# Patient Record
Sex: Female | Born: 1960
Health system: Southern US, Community
[De-identification: ages and names within clinical notes are randomized; demographics above are authoritative.]

## PROBLEM LIST (undated history)

## (undated) DIAGNOSIS — F419 Anxiety disorder, unspecified: Secondary | ICD-10-CM

## (undated) DIAGNOSIS — I10 Essential (primary) hypertension: Secondary | ICD-10-CM

## (undated) DIAGNOSIS — Z973 Presence of spectacles and contact lenses: Secondary | ICD-10-CM

## (undated) DIAGNOSIS — M1990 Unspecified osteoarthritis, unspecified site: Secondary | ICD-10-CM

## (undated) HISTORY — PX: DILATION AND CURETTAGE OF UTERUS: SHX78

---

## 1983-09-18 HISTORY — PX: ABDOMINAL HYSTERECTOMY: SHX81

## 1986-09-17 HISTORY — PX: LAPAROSCOPIC OOPHORECTOMY: SUR783

## 1987-09-18 HISTORY — PX: CARPAL TUNNEL RELEASE: SHX101

## 1996-09-17 HISTORY — PX: WISDOM TOOTH EXTRACTION: SHX21

## 2011-01-12 ENCOUNTER — Other Ambulatory Visit (HOSPITAL_COMMUNITY): Payer: Self-pay | Admitting: Internal Medicine

## 2011-01-12 DIAGNOSIS — Z1231 Encounter for screening mammogram for malignant neoplasm of breast: Secondary | ICD-10-CM

## 2011-01-25 ENCOUNTER — Ambulatory Visit (HOSPITAL_COMMUNITY)
Admission: RE | Admit: 2011-01-25 | Discharge: 2011-01-25 | Disposition: A | Discharge status: Home / Self Care | Payer: Self-pay | Source: Ambulatory Visit | Attending: Internal Medicine | Admitting: Internal Medicine

## 2011-01-25 DIAGNOSIS — Z1231 Encounter for screening mammogram for malignant neoplasm of breast: Secondary | ICD-10-CM

## 2013-09-22 ENCOUNTER — Ambulatory Visit (INDEPENDENT_AMBULATORY_CARE_PROVIDER_SITE_OTHER): Payer: No Typology Code available for payment source | Admitting: Family Medicine

## 2013-09-22 ENCOUNTER — Telehealth: Payer: Self-pay

## 2013-09-22 VITALS — BP 128/80 | HR 78 | Temp 98.4°F | Resp 17 | Ht 65.0 in | Wt 185.0 lb

## 2013-09-22 DIAGNOSIS — F411 Generalized anxiety disorder: Secondary | ICD-10-CM

## 2013-09-22 DIAGNOSIS — R6889 Other general symptoms and signs: Secondary | ICD-10-CM

## 2013-09-22 MED ORDER — ALPRAZOLAM 1 MG PO TABS: ORAL_TABLET | ORAL | Status: DC

## 2013-09-22 NOTE — Telephone Encounter (Signed)
I have never advised a patient not to come in because we are busy. We can not excuse patient from work, she was not seen. I have advised her if she is sick she should be seen. Advised her if we are open we will see her.

## 2013-09-22 NOTE — Patient Instructions (Signed)
Drink plenty of fluids and get enough rest  Tylenol or ibuprofen for aching  Tramadol is to be continued for the hand pain if it is bad  Minimize the use of the antianxiety medication, alprazolam   Return if worse

## 2013-09-22 NOTE — Progress Notes (Signed)
Subjective: 53 year old lady who has been sick since about Friday. She started out with a headache and a slight cough and achy and just feeling lousy. She was unable to get in easily due to the crowd, so she put off coming to the doctor. She has continued to have some headache and generalized malaise. No sore throat. No ear pain. Continues with a little cough nonproductive. She is a nonsmoker. She had a little nausea this morning, but not like she was going to actually going to vomit. She works at 2 jobs, putting in long hours. She did get a flu shot this year.  Objective: TMs are normal. Throat clear. Neck supple without significant nodes. Chest is clear to auscultation. Heart regular without murmurs. She has a splint on her right wrist. Dr. Cheree DittoGraham he is going to do surgery on it. She saw Dr. Lyman BishopLawrence time at the other clinic he volunteers at and he gave her something for anxiety there.  Assessment: Flulike illness Generalized malaise Wrist pain Anxiety  Plan: Refill the alprazolam  Treat the flu symptoms symptomatically. Discussed with patient.

## 2013-09-22 NOTE — Telephone Encounter (Signed)
PT STATES SHE WAS COMING OVER TO BE SEEN ON Friday AND WAS TOLD BY SOMEONE HERE NOT TO COME BECAUSE IT WAS SO HECTIC AT THE TIME, NOW SHE NEED A NOTE FOR Friday EXCUSING HER FROM WORK SINCE WE TOLD HER NOT TO COME OVER. PLEASE CALL PT AT [phone] AND THE FAX IS [phone]

## 2013-12-07 ENCOUNTER — Other Ambulatory Visit: Payer: Self-pay | Admitting: Orthopedic Surgery

## 2014-02-23 ENCOUNTER — Encounter (HOSPITAL_BASED_OUTPATIENT_CLINIC_OR_DEPARTMENT_OTHER): Payer: Self-pay | Admitting: *Deleted

## 2014-02-23 NOTE — Progress Notes (Signed)
Pt has no cardiac or resp problems-will bring meds and overnight bag

## 2014-02-26 ENCOUNTER — Encounter (HOSPITAL_BASED_OUTPATIENT_CLINIC_OR_DEPARTMENT_OTHER): Payer: Self-pay | Admitting: *Deleted

## 2014-02-26 ENCOUNTER — Encounter (HOSPITAL_BASED_OUTPATIENT_CLINIC_OR_DEPARTMENT_OTHER): Payer: No Typology Code available for payment source | Admitting: Anesthesiology

## 2014-02-26 ENCOUNTER — Ambulatory Visit (HOSPITAL_BASED_OUTPATIENT_CLINIC_OR_DEPARTMENT_OTHER)
Admission: RE | Admit: 2014-02-26 | Discharge: 2014-02-27 | Disposition: A | Discharge status: Home / Self Care | Payer: No Typology Code available for payment source | Source: Ambulatory Visit | Attending: Orthopedic Surgery | Admitting: Orthopedic Surgery

## 2014-02-26 ENCOUNTER — Ambulatory Visit (HOSPITAL_BASED_OUTPATIENT_CLINIC_OR_DEPARTMENT_OTHER): Payer: No Typology Code available for payment source | Admitting: Anesthesiology

## 2014-02-26 ENCOUNTER — Encounter (HOSPITAL_BASED_OUTPATIENT_CLINIC_OR_DEPARTMENT_OTHER)
Admission: RE | Disposition: A | Discharge status: Home / Self Care | Payer: Self-pay | Source: Ambulatory Visit | Attending: Orthopedic Surgery

## 2014-02-26 DIAGNOSIS — Z79899 Other long term (current) drug therapy: Secondary | ICD-10-CM | POA: Insufficient documentation

## 2014-02-26 DIAGNOSIS — M189 Osteoarthritis of first carpometacarpal joint, unspecified: Secondary | ICD-10-CM | POA: Diagnosis present

## 2014-02-26 DIAGNOSIS — M19049 Primary osteoarthritis, unspecified hand: Secondary | ICD-10-CM | POA: Insufficient documentation

## 2014-02-26 DIAGNOSIS — F411 Generalized anxiety disorder: Secondary | ICD-10-CM | POA: Insufficient documentation

## 2014-02-26 HISTORY — DX: Presence of spectacles and contact lenses: Z97.3

## 2014-02-26 HISTORY — PX: CARPOMETACARPAL (CMC) FUSION OF THUMB: SHX6290

## 2014-02-26 HISTORY — DX: Anxiety disorder, unspecified: F41.9

## 2014-02-26 LAB — POCT HEMOGLOBIN-HEMACUE: Hemoglobin: 12.4 g/dL (ref 12.0–15.0)

## 2014-02-26 SURGERY — CARPOMETACARPAL (CMC) FUSION OF THUMB
Anesthesia: General | Site: Thumb | Laterality: Right

## 2014-02-26 MED ORDER — OXYCODONE HCL 5 MG/5ML PO SOLN: 5.0000 mg | Freq: Once | ORAL | Status: AC | PRN

## 2014-02-26 MED ORDER — DEXAMETHASONE SODIUM PHOSPHATE 10 MG/ML IJ SOLN
INTRAMUSCULAR | Status: DC | PRN
  Administered 2014-02-26: 10 mg via INTRAVENOUS

## 2014-02-26 MED ORDER — MIDAZOLAM HCL 2 MG/2ML IJ SOLN
INTRAMUSCULAR | Status: AC
  Filled 2014-02-26: qty 2

## 2014-02-26 MED ORDER — ONDANSETRON HCL 4 MG/2ML IJ SOLN
INTRAMUSCULAR | Status: DC | PRN
  Administered 2014-02-26: 4 mg via INTRAVENOUS

## 2014-02-26 MED ORDER — LACTATED RINGERS IV SOLN
INTRAVENOUS | Status: DC
  Administered 2014-02-26: 12:00:00 via INTRAVENOUS

## 2014-02-26 MED ORDER — BETAMETHASONE SOD PHOS & ACET 6 (3-3) MG/ML IJ SUSP
INTRAMUSCULAR | Status: DC | PRN
  Administered 2014-02-26: 1 mL via INTRA_ARTICULAR

## 2014-02-26 MED ORDER — MIDAZOLAM HCL 2 MG/2ML IJ SOLN
1.0000 mg | INTRAMUSCULAR | Status: DC | PRN
Start: 2014-02-26 — End: 2014-02-26
  Administered 2014-02-26: 2 mg via INTRAVENOUS

## 2014-02-26 MED ORDER — LACTATED RINGERS IV SOLN
INTRAVENOUS | Status: DC
  Administered 2014-02-26 (×2): via INTRAVENOUS

## 2014-02-26 MED ORDER — SODIUM CHLORIDE 0.45 % IV SOLN: INTRAVENOUS | Status: DC

## 2014-02-26 MED ORDER — PROPOFOL 10 MG/ML IV BOLUS
INTRAVENOUS | Status: DC | PRN
  Administered 2014-02-26: 200 mg via INTRAVENOUS

## 2014-02-26 MED ORDER — CHLORHEXIDINE GLUCONATE 4 % EX LIQD
60.0000 mL | Freq: Once | CUTANEOUS | Status: DC
Start: 2014-02-26 — End: 2014-02-26

## 2014-02-26 MED ORDER — METHOCARBAMOL 500 MG PO TABS: 500.0000 mg | ORAL_TABLET | Freq: Four times a day (QID) | ORAL | Status: DC

## 2014-02-26 MED ORDER — TRAZODONE HCL 50 MG PO TABS
50.0000 mg | ORAL_TABLET | Freq: Every day | ORAL | Status: DC
  Administered 2014-02-26: 50 mg via ORAL

## 2014-02-26 MED ORDER — HYDROMORPHONE HCL PF 1 MG/ML IJ SOLN: 0.2500 mg | INTRAMUSCULAR | Status: DC | PRN

## 2014-02-26 MED ORDER — FAMOTIDINE 20 MG PO TABS: 20.0000 mg | ORAL_TABLET | Freq: Two times a day (BID) | ORAL | Status: DC | PRN

## 2014-02-26 MED ORDER — FENTANYL CITRATE 0.05 MG/ML IJ SOLN
INTRAMUSCULAR | Status: AC
  Filled 2014-02-26: qty 2

## 2014-02-26 MED ORDER — BUPIVACAINE HCL (PF) 0.25 % IJ SOLN
INTRAMUSCULAR | Status: AC
  Filled 2014-02-26: qty 30

## 2014-02-26 MED ORDER — VANCOMYCIN HCL IN DEXTROSE 1-5 GM/200ML-% IV SOLN
1000.0000 mg | Freq: Two times a day (BID) | INTRAVENOUS | Status: DC
  Administered 2014-02-26: 1000 mg via INTRAVENOUS
  Filled 2014-02-26 (×2): qty 200

## 2014-02-26 MED ORDER — BUPIVACAINE HCL (PF) 0.5 % IJ SOLN
INTRAMUSCULAR | Status: AC
  Filled 2014-02-26: qty 30

## 2014-02-26 MED ORDER — LIDOCAINE HCL 1 % IJ SOLN
INTRAMUSCULAR | Status: DC | PRN
  Administered 2014-02-26: 2 mL via INTRADERMAL

## 2014-02-26 MED ORDER — OXYCODONE HCL 5 MG PO TABS
5.0000 mg | ORAL_TABLET | Freq: Once | ORAL | Status: AC | PRN
Start: 2014-02-26 — End: 2014-02-26
  Administered 2014-02-26: 5 mg via ORAL

## 2014-02-26 MED ORDER — VITAMIN C 500 MG PO TABS
1000.0000 mg | ORAL_TABLET | Freq: Every day | ORAL | Status: DC
  Administered 2014-02-26: 1000 mg via ORAL
  Filled 2014-02-26: qty 2

## 2014-02-26 MED ORDER — FENTANYL CITRATE 0.05 MG/ML IJ SOLN
50.0000 ug | INTRAMUSCULAR | Status: DC | PRN
  Administered 2014-02-26: 100 ug via INTRAVENOUS

## 2014-02-26 MED ORDER — EPHEDRINE SULFATE 50 MG/ML IJ SOLN
INTRAMUSCULAR | Status: DC | PRN
  Administered 2014-02-26: 10 mg via INTRAVENOUS

## 2014-02-26 MED ORDER — VANCOMYCIN HCL IN DEXTROSE 1-5 GM/200ML-% IV SOLN
INTRAVENOUS | Status: AC
  Filled 2014-02-26: qty 200

## 2014-02-26 MED ORDER — ROPIVACAINE HCL 5 MG/ML IJ SOLN
INTRAMUSCULAR | Status: DC | PRN
  Administered 2014-02-26: 35 mL via PERINEURAL

## 2014-02-26 MED ORDER — ALPRAZOLAM 0.5 MG PO TABS: 0.5000 mg | ORAL_TABLET | Freq: Four times a day (QID) | ORAL | Status: DC | PRN

## 2014-02-26 MED ORDER — BUPIVACAINE HCL (PF) 0.25 % IJ SOLN
INTRAMUSCULAR | Status: DC | PRN
  Administered 2014-02-26: 1 mL

## 2014-02-26 MED ORDER — LIDOCAINE HCL (CARDIAC) 20 MG/ML IV SOLN
INTRAVENOUS | Status: DC | PRN
  Administered 2014-02-26: 40 mg via INTRAVENOUS

## 2014-02-26 MED ORDER — HYDROMORPHONE HCL PF 1 MG/ML IJ SOLN
0.5000 mg | INTRAMUSCULAR | Status: DC | PRN
  Administered 2014-02-27: 1 mg via INTRAVENOUS
  Filled 2014-02-26: qty 1

## 2014-02-26 MED ORDER — METOCLOPRAMIDE HCL 5 MG/ML IJ SOLN
INTRAMUSCULAR | Status: AC
  Filled 2014-02-26: qty 2

## 2014-02-26 MED ORDER — METOCLOPRAMIDE HCL 5 MG/ML IJ SOLN
10.0000 mg | Freq: Once | INTRAMUSCULAR | Status: AC | PRN
  Administered 2014-02-26: 10 mg via INTRAVENOUS

## 2014-02-26 MED ORDER — OXYCODONE HCL 5 MG PO TABS
5.0000 mg | ORAL_TABLET | ORAL | Status: DC | PRN
Start: 2014-02-26 — End: 2014-02-27
  Administered 2014-02-27: 10 mg via ORAL
  Administered 2014-02-27: 5 mg via ORAL
  Filled 2014-02-26: qty 1
  Filled 2014-02-26: qty 2
  Filled 2014-02-26: qty 1

## 2014-02-26 MED ORDER — DOCUSATE SODIUM 100 MG PO CAPS
100.0000 mg | ORAL_CAPSULE | Freq: Two times a day (BID) | ORAL | Status: DC
  Administered 2014-02-26: 100 mg via ORAL
  Filled 2014-02-26: qty 1

## 2014-02-26 MED ORDER — VANCOMYCIN HCL IN DEXTROSE 1-5 GM/200ML-% IV SOLN
1000.0000 mg | INTRAVENOUS | Status: AC
  Administered 2014-02-26 (×2): 1000 mg via INTRAVENOUS

## 2014-02-26 MED ORDER — ROPIVACAINE HCL 5 MG/ML IJ SOLN: INTRAMUSCULAR | Status: DC | PRN

## 2014-02-26 MED ORDER — OXYCODONE HCL 5 MG PO TABS: 5.0000 mg | ORAL_TABLET | ORAL | Status: DC | PRN

## 2014-02-26 SURGICAL SUPPLY — 73 items
BANDAGE ELASTIC 3 VELCRO ST LF (GAUZE/BANDAGES/DRESSINGS) ×3 IMPLANT
BIT DRILL CANN 3.5X160 QC (BIT) ×3 IMPLANT
BLADE SURG 15 STRL LF DISP TIS (BLADE) ×3 IMPLANT
BLADE SURG 15 STRL SS (BLADE) ×6
BLADE SURG ROTATE 9660 (MISCELLANEOUS) IMPLANT
BNDG CONFORM 3 STRL LF (GAUZE/BANDAGES/DRESSINGS) ×3 IMPLANT
BNDG GAUZE ELAST 4 BULKY (GAUZE/BANDAGES/DRESSINGS) ×3 IMPLANT
BRUSH SCRUB EZ PLAIN DRY (MISCELLANEOUS) ×3 IMPLANT
CANISTER SUCT 1200ML W/VALVE (MISCELLANEOUS) ×3 IMPLANT
CORDS BIPOLAR (ELECTRODE) ×3 IMPLANT
COVER MAYO STAND STRL (DRAPES) ×3 IMPLANT
COVER TABLE BACK 60X90 (DRAPES) ×3 IMPLANT
CUFF TOURNIQUET SINGLE 18IN (TOURNIQUET CUFF) ×3 IMPLANT
DECANTER SPIKE VIAL GLASS SM (MISCELLANEOUS) IMPLANT
DRAIN TLS ROUND 10FR (DRAIN) IMPLANT
DRAPE EXTREMITY T 121X128X90 (DRAPE) ×3 IMPLANT
DRAPE OEC MINIVIEW 54X84 (DRAPES) ×3 IMPLANT
DRAPE SURG 17X23 STRL (DRAPES) ×3 IMPLANT
DRSG EMULSION OIL 3X3 NADH (GAUZE/BANDAGES/DRESSINGS) ×3 IMPLANT
FIBERLOOP 2 0 (SUTURE) ×6 IMPLANT
GAUZE SPONGE 4X4 12PLY STRL (GAUZE/BANDAGES/DRESSINGS) ×6 IMPLANT
GAUZE SPONGE 4X4 16PLY XRAY LF (GAUZE/BANDAGES/DRESSINGS) IMPLANT
GAUZE XEROFORM 1X8 LF (GAUZE/BANDAGES/DRESSINGS) ×3 IMPLANT
GLOVE BIO SURGEON STRL SZ 6.5 (GLOVE) ×2 IMPLANT
GLOVE BIO SURGEONS STRL SZ 6.5 (GLOVE) ×1
GLOVE BIOGEL M STRL SZ7.5 (GLOVE) IMPLANT
GLOVE BIOGEL PI IND STRL 7.0 (GLOVE) ×1 IMPLANT
GLOVE BIOGEL PI INDICATOR 7.0 (GLOVE) ×2
GLOVE SS BIOGEL STRL SZ 8 (GLOVE) ×1 IMPLANT
GLOVE SUPERSENSE BIOGEL SZ 8 (GLOVE) ×2
GOWN STRL REUS W/ TWL LRG LVL3 (GOWN DISPOSABLE) ×1 IMPLANT
GOWN STRL REUS W/ TWL XL LVL3 (GOWN DISPOSABLE) ×1 IMPLANT
GOWN STRL REUS W/TWL LRG LVL3 (GOWN DISPOSABLE) ×2
GOWN STRL REUS W/TWL XL LVL3 (GOWN DISPOSABLE) ×2
GUIDEWIRE THREADED 150MM (WIRE) ×3 IMPLANT
NEEDLE HYPO 22GX1.5 SAFETY (NEEDLE) ×3 IMPLANT
NEEDLE HYPO 25X1 1.5 SAFETY (NEEDLE) ×3 IMPLANT
NS IRRIG 1000ML POUR BTL (IV SOLUTION) ×3 IMPLANT
PACK BASIN DAY SURGERY FS (CUSTOM PROCEDURE TRAY) ×3 IMPLANT
PAD CAST 3X4 CTTN HI CHSV (CAST SUPPLIES) ×2 IMPLANT
PADDING CAST ABS 3INX4YD NS (CAST SUPPLIES) ×2
PADDING CAST ABS 4INX4YD NS (CAST SUPPLIES) ×2
PADDING CAST ABS COTTON 3X4 (CAST SUPPLIES) ×1 IMPLANT
PADDING CAST ABS COTTON 4X4 ST (CAST SUPPLIES) ×1 IMPLANT
PADDING CAST COTTON 3X4 STRL (CAST SUPPLIES) ×4
PASSER SUT SWANSON 36MM LOOP (INSTRUMENTS) IMPLANT
SCREW BIOCOM TENODESIS 3.8X8M (Screw) ×3 IMPLANT
SHEET MEDIUM DRAPE 40X70 STRL (DRAPES) IMPLANT
SLING ARM LRG ADULT FOAM STRAP (SOFTGOODS) ×3 IMPLANT
SPLINT FIBERGLASS 3X35 (CAST SUPPLIES) ×3 IMPLANT
SPLINT PLASTER CAST XFAST 3X15 (CAST SUPPLIES) IMPLANT
SPLINT PLASTER XTRA FASTSET 3X (CAST SUPPLIES)
SPONGE SURGIFOAM ABS GEL 12-7 (HEMOSTASIS) ×3 IMPLANT
STOCKINETTE 4X48 STRL (DRAPES) ×3 IMPLANT
STOCKINETTE SYNTHETIC 3 UNSTER (CAST SUPPLIES) ×3 IMPLANT
SUCTION FRAZIER TIP 10 FR DISP (SUCTIONS) ×3 IMPLANT
SUT BONE WAX W31G (SUTURE) IMPLANT
SUT FIBERWIRE 3-0 18 TAPR NDL (SUTURE) ×6
SUT FIBERWIRE 4-0 18 TAPR NDL (SUTURE)
SUT PROLENE 4 0 PS 2 18 (SUTURE) ×6 IMPLANT
SUT VIC AB 4-0 P-3 18XBRD (SUTURE) IMPLANT
SUT VIC AB 4-0 P3 18 (SUTURE)
SUTURE FIBERWR 3-0 18 TAPR NDL (SUTURE) ×2 IMPLANT
SUTURE FIBERWR 4-0 18 TAPR NDL (SUTURE) IMPLANT
SYR BULB 3OZ (MISCELLANEOUS) ×3 IMPLANT
SYR CONTROL 10ML LL (SYRINGE) ×3 IMPLANT
TAPE SURG TRANSPORE 1 IN (GAUZE/BANDAGES/DRESSINGS) ×1 IMPLANT
TAPE SURGICAL TRANSPORE 1 IN (GAUZE/BANDAGES/DRESSINGS) ×2
TOWEL OR 17X24 6PK STRL BLUE (TOWEL DISPOSABLE) ×6 IMPLANT
TOWEL OR NON WOVEN STRL DISP B (DISPOSABLE) ×3 IMPLANT
TUBE CONNECTING 20'X1/4 (TUBING) ×1
TUBE CONNECTING 20X1/4 (TUBING) ×2 IMPLANT
UNDERPAD 30X30 INCONTINENT (UNDERPADS AND DIAPERS) ×3 IMPLANT

## 2014-02-26 NOTE — H&P (Signed)
Maryclare BeanDella Hockman is an 53 y.o. female.   Chief Complaint: right thumb CMC DJD HPI: presents for right CMC Aplasty and left CMC injection Patient presents for evaluation and treatment of the of their upper extremity predicament. The patient denies neck back chest or of abdominal pain. The patient notes that they have no lower extremity problems. The patient from primarily complains of the upper extremity pain noted.  Past Medical History  Diagnosis Date  . Wears glasses   . Anxiety     Past Surgical History  Procedure Laterality Date  . Abdominal hysterectomy  1985  . Dilation and curettage of uterus    . Laparoscopic oophorectomy  1988    loa  . Wisdom tooth extraction  1998  . Carpal tunnel release  1989    left    History reviewed. No pertinent family history. Social History:  reports that she has never smoked. She does not have any smokeless tobacco history on file. She reports that she drinks alcohol. She reports that she does not use illicit drugs.  Allergies:  Allergies  Allergen Reactions  . Amoxicillin Other (See Comments)    Yeast infections   . Penicillins Other (See Comments)    Yeast infections     Medications Prior to Admission  Medication Sig Dispense Refill  . ALPRAZolam (XANAX) 1 MG tablet One half to one at bedtime if needed for anxiety  30 tablet  1  . traZODone (DESYREL) 50 MG tablet Take 50 mg by mouth at bedtime.      . traMADol (ULTRAM) 50 MG tablet Take by mouth every 6 (six) hours as needed.        Results for orders placed during the hospital encounter of 02/26/14 (from the past 48 hour(s))  POCT HEMOGLOBIN-HEMACUE     Status: None   Collection Time    02/26/14  7:00 AM      Result Value Ref Range   Hemoglobin 12.4  12.0 - 15.0 g/dL   No results found.  Review of Systems  Respiratory: Negative.   Cardiovascular: Negative.   Gastrointestinal: Negative.   Genitourinary: Negative.   Neurological: Negative.   Psychiatric/Behavioral:  Negative.     Blood pressure 141/81, pulse 71, temperature 98.2 F (36.8 C), temperature source Oral, resp. rate 16, height 5\' 5"  (1.651 m), weight 82.555 kg (182 lb), SpO2 95.00%. Physical Exam bilateral CMC DJD thumbs The patient is alert and oriented in no acute distress the patient complains of pain in the affected upper extremity.  The patient is noted to have a normal HEENT exam.  Lung fields show equal chest expansion and no shortness of breath  abdomen exam is nontender without distention.  Lower extremity examination does not show any fracture dislocation or blood clot symptoms.  Pelvis is stable neck and back are stable and nontender  Assessment/Plan Plan left CMC injection and right Thumb CMC Aplasty We are planning surgery for your upper extremity. The risk and benefits of surgery include risk of bleeding infection anesthesia damage to normal structures and failure of the surgery to accomplish its intended goals of relieving symptoms and restoring function with this in mind we'll going to proceed. I have specifically discussed with the patient the pre-and postoperative regime and the does and don'ts and risk and benefits in great detail. Risk and benefits of surgery also include risk of dystrophy chronic nerve pain failure of the healing process to go onto completion and other inherent risks of surgery The relavent the pathophysiology  of the disease/injury process, as well as the alternatives for treatment and postoperative course of action has been discussed in great detail with the patient who desires to proceed.  We will do everything in our power to help you (the patient) restore function to the upper extremity. Is a pleasure to see this patient today.   Karen ChafeGRAMIG III,Hessie Varone M 02/26/2014, 7:33 AM

## 2014-02-26 NOTE — Progress Notes (Signed)
Assisted Dr. Frederick with right, ultrasound guided, supraclavicular block. Side rails up, monitors on throughout procedure. See vital signs in flow sheet. Tolerated Procedure well. 

## 2014-02-26 NOTE — Progress Notes (Signed)
ANTIBIOTIC CONSULT NOTE - INITIAL  Pharmacy Consult for vancomycin Indication: orthopedic procedure  Allergies  Allergen Reactions  . Amoxicillin Other (See Comments)    Yeast infections   . Penicillins Other (See Comments)    Yeast infections     Patient Measurements: Height: 5\' 5"  (165.1 cm) Weight: 182 lb (82.555 kg) IBW/kg (Calculated) : 57 Adjusted Body Weight:  Vital Signs: Temp: 98.2 F (36.8 C) (06/12 0615) Temp src: Oral (06/12 0615) BP: 151/86 mmHg (06/12 1030) Pulse Rate: 84 (06/12 1030) Intake/Output from previous day:   Intake/Output from this shift: Total I/O In: 1640 [P.O.:240; I.V.:1400] Out: -   Labs:  Recent Labs  02/26/14 0700  HGB 12.4   CrCl is unknown because no creatinine reading has been taken. No results found for this basename: VANCOTROUGH, VANCOPEAK, VANCORANDOM, GENTTROUGH, GENTPEAK, GENTRANDOM, TOBRATROUGH, TOBRAPEAK, TOBRARND, AMIKACINPEAK, AMIKACINTROU, AMIKACIN,  in the last 72 hours   Microbiology: No results found for this or any previous visit (from the past 720 hour(s)).  Medical History: Past Medical History  Diagnosis Date  . Wears glasses   . Anxiety     Medications:  See EMR  Assessment: 53 year old female presents for surgery to R upper extremity.  Received vancomycin 1 g IV x 2 doses this am.  Patient currently has no Scr on file, but has no documented history of any type of renal injury or disease.  Based on age and weight will assume CrCl > 60 ml/min.  Goal of Therapy:  Vancomycin trough level 15-20 mcg/ml  Plan:  -Vancomycin 1 g IV q12h -If vancomycin continues, will need Scr -Vanc trough as indicated    Agapito GamesAlison Demetri Kerman, PharmD, BCPS Clinical Pharmacist Pager: [phone] 02/26/2014 10:49 AM

## 2014-02-26 NOTE — Anesthesia Procedure Notes (Addendum)
Anesthesia Regional Block:  Supraclavicular block  Pre-Anesthetic Checklist: ,, timeout performed, Correct Patient, Correct Site, Correct Laterality, Correct Procedure, Correct Position, site marked, Risks and benefits discussed,  Surgical consent,  Pre-op evaluation,  At surgeon's request and post-op pain management  Laterality: Right  Prep: chloraprep       Needles:   Needle Type: Other     Needle Length: 9cm 9 cm Needle Gauge: 21 and 21 G    Additional Needles:  Procedures: ultrasound guided (picture in chart) Supraclavicular block Narrative:  Start time: 02/26/2014 7:00 AM End time: 02/26/2014 7:09 AM Injection made incrementally with aspirations every 5 mL.  Performed by: Personally  Anesthesiologist: C Frederick  Additional Notes: Ultrasound guidance used to: id relevant anatomy, confirm needle position, local anesthetic spread, avoidance of vascular puncture. Picture saved. No complications. Block performed personally by Janetta Horaharles E. Gelene MinkFrederick, MD     Procedure Name: LMA Insertion Date/Time: 02/26/2014 7:41 AM Performed by: Burna CashONRAD, Josepha Barbier C Pre-anesthesia Checklist: Patient identified, Emergency Drugs available, Suction available and Patient being monitored Patient Re-evaluated:Patient Re-evaluated prior to inductionOxygen Delivery Method: Circle System Utilized Preoxygenation: Pre-oxygenation with 100% oxygen Intubation Type: IV induction Ventilation: Mask ventilation without difficulty LMA: LMA inserted LMA Size: 4.0 Number of attempts: 1 Airway Equipment and Method: bite block Placement Confirmation: positive ETCO2 Tube secured with: Tape Dental Injury: Teeth and Oropharynx as per pre-operative assessment

## 2014-02-26 NOTE — Anesthesia Postprocedure Evaluation (Signed)
Anesthesia Post Note  Patient: Catherine Cruz  Procedure(s) Performed: Procedure(s) (LRB): RIGHT CARPOMETACARPAL (CMC) ARTHROPLASTY OF THUMB/ZANCOLLI ARTHROPLASTY/LEFT THUMB CMC INJECTION (Right)  Anesthesia type: General  Patient location: PACU  Post pain: Pain level controlled  Post assessment: Patient's Cardiovascular Status Stable  Last Vitals:  Filed Vitals:   02/26/14 1015  BP: 143/84  Pulse: 64  Temp:   Resp: 19    Post vital signs: Reviewed and stable  Level of consciousness: alert  Complications: No apparent anesthesia complications

## 2014-02-26 NOTE — Op Note (Signed)
See ZOXWRUEAV#[id number]#[id number] Amanda PeaGramig MD

## 2014-02-26 NOTE — Anesthesia Preprocedure Evaluation (Signed)
Anesthesia Evaluation  Patient identified by MRN, date of birth, ID band Patient awake    Reviewed: Allergy & Precautions, H&P , NPO status , Patient's Chart, lab work & pertinent test results, reviewed documented beta blocker date and time   Airway Mallampati: II TM Distance: >3 FB Neck ROM: full    Dental   Pulmonary neg pulmonary ROS,  breath sounds clear to auscultation        Cardiovascular negative cardio ROS  Rhythm:regular     Neuro/Psych negative neurological ROS  negative psych ROS   GI/Hepatic negative GI ROS, Neg liver ROS,   Endo/Other  negative endocrine ROS  Renal/GU negative Renal ROS  negative genitourinary   Musculoskeletal   Abdominal   Peds  Hematology negative hematology ROS (+)   Anesthesia Other Findings See surgeon's H&P   Reproductive/Obstetrics negative OB ROS                           Anesthesia Physical Anesthesia Plan  ASA: I  Anesthesia Plan: General   Post-op Pain Management:    Induction: Intravenous  Airway Management Planned: LMA  Additional Equipment:   Intra-op Plan:   Post-operative Plan:   Informed Consent: I have reviewed the patients History and Physical, chart, labs and discussed the procedure including the risks, benefits and alternatives for the proposed anesthesia with the patient or authorized representative who has indicated his/her understanding and acceptance.   Dental Advisory Given  Plan Discussed with: CRNA and Surgeon  Anesthesia Plan Comments:         Anesthesia Quick Evaluation  

## 2014-02-26 NOTE — Transfer of Care (Signed)
Immediate Anesthesia Transfer of Care Note  Patient: Catherine BeanDella Cruz  Procedure(s) Performed: Procedure(s) with comments: RIGHT CARPOMETACARPAL (CMC) ARTHROPLASTY OF THUMB/ZANCOLLI ARTHROPLASTY/LEFT THUMB CMC INJECTION (Right) - ANESTHESIA: GENERAL WITH BLOCK  Patient Location: PACU  Anesthesia Type:General  Level of Consciousness: sedated  Airway & Oxygen Therapy: Patient Spontanous Breathing and Patient connected to face mask oxygen  Post-op Assessment: Report given to PACU RN and Post -op Vital signs reviewed and stable  Post vital signs: Reviewed and stable  Complications: No apparent anesthesia complications

## 2014-02-27 NOTE — Discharge Instructions (Signed)

## 2014-03-01 ENCOUNTER — Encounter (HOSPITAL_BASED_OUTPATIENT_CLINIC_OR_DEPARTMENT_OTHER): Payer: Self-pay | Admitting: Orthopedic Surgery

## 2014-03-01 NOTE — Op Note (Signed)
NAMMaryclare Cruz:  Cruz, Ellar           ACCOUNT NO.:  [id number]  MEDICAL RECORD NO.:  [id number]  LOCATION:                                FACILITY:  [facility]  PHYSICIAN:  Dionne AnoWilliam M. Angenette Daily, M.D.DATE OF BIRTH:  [birth year]  DATE OF PROCEDURE: DATE OF DISCHARGE:  02/26/2014                              OPERATIVE REPORT   PREOPERATIVE DIAGNOSES: 1. 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 2. Left thumb carpometacarpal arthritis.  POSTOPERATIVE DIAGNOSIS: 1. 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 2. Left thumb carpometacarpal arthritis.  PROCEDURE: 1. Right thumb CMC arthroplasty (removal of trapezium at the basilar     thumb joint), right basilar thumb/wrist region. 2. Abductor pollicis longus, one-third proper portion tendon transfer     to the FCR, APL __________ upon themselves with multiple figure-of-     eight throws (__________ tendon transfer) right basilar thumb     joint. 3. Abductor pollicis longus digastric portion tendon transfer to the     FCR, first metacarpal and back upon the first metacarpal, and FCR     region with Bio-Tenodesis screw fixation about the metacarpal     (Zancolli tendon transfer). 4. Adductor pollicis longus tenodesis (shortening of her wrist     extensors to wrist __________ level to prevent dorsolateral     __________, right basilar thumb joint. 5. Left thumb carpometacarpal injection with 1 mL of Celestone and 1     mL of Sensorcaine.  SURGEON:  Dionne AnoWilliam M. Amanda PeaGramig, M.D.  ASSISTANT:  None.  COMPLICATIONS:  None.  ANESTHESIA:  General, preoperative block.  TOURNIQUET TIME:  Less than 90 minutes.  INDICATIONS:  This is a 53 year old female who presents with the above- mentioned diagnosis, I have counseled her in regard to risks and benefit of surgery including risk of infection, bleeding, anesthesia, damage  to normal structures, and failure of surgery to accomplish its intended goals of relieving symptoms and restoring function.  With this in mind, she desires to proceed.  All questions have been encouraged and answered preoperatively.  OPERATIVE PROCEDURE:  The patient was seen by myself and Anesthesia, taken to the operating suite and underwent smooth induction of general anesthesia, time-out called.  Pre and postop check was complete.  SCD hose placed with compression devices and following this, she was prepped and draped in usual sterile fashion.  Once this done, operation commenced about the right upper extremity with dorsal radial incision. Dissection was carried out, superficial radial nerve was identified medially, laterally about the incision site, swept out of harm's way. The EPB and APL tendon sheaths were identified and opened.  Radial artery was identified.  Following this, I retracted the neurovascular structures out of harm's way, made an incision in the capsule about the [facility] joint, reflected the capsule and then excised the trapezium in piecemeal.  There were no complicating features.  Once this done, I then performed FCR tenolysis, tenosynovectomy, and created a drill hole dorsal to palmar extending intra-articularly in line with palmar beak ligament, this was enlarged to a drill bit approximately.  I irrigated and this of course completed  the arthroplasty portion of procedure.  Following this, I made the counterincision, dissected down about the distal third of the forearm and harvested the digastric portion of the APL and a one-third proper portion of the APL, these tendons were clipped proximally, retrieved distally through the main wound and following this, I performed APL digastric portion tendon transfer to the first metacarpal around the FCR twice and then back up through the metacarpal.  This was secured with FiberWire and a 3 x 8 mm Bio- Tenodesis screw was then  placed in the hole to capture and perform excellent fixation point.  This was done without difficulty.  Following this, which was the Zancolli tendon transfer, we then turned attention towards the APL proper portion. This was placed through the FCR sheath and FCR tendon in the base of the [facility] joint and around the APL and then back upon themselves with multiple figure-of-eight throws. This was secured with FiberWire and completed the __________ tendon transfer.  Following this, we performed APL tenodesis, this was shortening of her wrist extensor at the wrist forearm level to prevent dorsolateral __________.  This was secured with FiberWire.  There were no complicating features.  Once this was complete, we then performed very careful and cautious irrigation followed by complex capsular closure. Gelfoam was placed in the void/suspension region and all looked quite well.  Hemostasis was secured with Bipolar electrocautery __________ bleeding points.  The superficial radial nerve and the radial artery were carefully looked at, dissected, and looked excellent.  I was quite pleased with this in the findings.  Following this, we then irrigated, closed the wound, and placed in a thumb spica splint.  Once this was complete, I then turned attention towards the left thumb which underwent a CMC injection with 1 mL of Celestone and 1 mL of Sensorcaine with epinephrine.  She was taken to recovery room in stable condition.  All sponge, needle, and instrument counts were reported as correct.  She will be monitored, discharged home tomorrow accordingly.     Dionne AnoWilliam M. Amanda PeaGramig, M.D.     [facility]MED[facility]  D:  02/26/2014  T:  02/26/2014  Job:  [id number]

## 2014-04-06 ENCOUNTER — Other Ambulatory Visit: Payer: Self-pay | Admitting: Family Medicine

## 2015-09-14 ENCOUNTER — Other Ambulatory Visit: Payer: Self-pay | Admitting: Orthopedic Surgery

## 2015-09-27 ENCOUNTER — Encounter (HOSPITAL_BASED_OUTPATIENT_CLINIC_OR_DEPARTMENT_OTHER): Payer: Self-pay | Admitting: *Deleted

## 2015-09-29 NOTE — Anesthesia Preprocedure Evaluation (Addendum)
Anesthesia Evaluation  Patient identified by MRN, date of birth, ID band Patient awake    Reviewed: Allergy & Precautions, H&P , NPO status , Patient's Chart, lab work & pertinent test results, reviewed documented beta blocker date and time   Airway Mallampati: II  TM Distance: >3 FB Neck ROM: Full    Dental  (+) Edentulous Upper, Dental Advisory Given   Pulmonary neg pulmonary ROS,    Pulmonary exam normal        Cardiovascular negative cardio ROS Normal cardiovascular exam     Neuro/Psych PSYCHIATRIC DISORDERS Anxiety negative neurological ROS     GI/Hepatic negative GI ROS, Neg liver ROS,   Endo/Other  negative endocrine ROS  Renal/GU negative Renal ROS  negative genitourinary   Musculoskeletal   Abdominal   Peds  Hematology negative hematology ROS (+)   Anesthesia Other Findings See surgeon's H&P   Reproductive/Obstetrics negative OB ROS                            Anesthesia Physical  Anesthesia Plan  ASA: I  Anesthesia Plan: General   Post-op Pain Management: GA combined w/ Regional for post-op pain   Induction: Intravenous  Airway Management Planned: LMA  Additional Equipment:   Intra-op Plan:   Post-operative Plan: Extubation in OR  Informed Consent: I have reviewed the patients History and Physical, chart, labs and discussed the procedure including the risks, benefits and alternatives for the proposed anesthesia with the patient or authorized representative who has indicated his/her understanding and acceptance.   Dental advisory given  Plan Discussed with: CRNA, Anesthesiologist and Surgeon  Anesthesia Plan Comments:        Anesthesia Quick Evaluation

## 2015-09-30 ENCOUNTER — Ambulatory Visit (HOSPITAL_BASED_OUTPATIENT_CLINIC_OR_DEPARTMENT_OTHER): Payer: Managed Care, Other (non HMO) | Admitting: Anesthesiology

## 2015-09-30 ENCOUNTER — Encounter (HOSPITAL_BASED_OUTPATIENT_CLINIC_OR_DEPARTMENT_OTHER): Payer: Self-pay | Admitting: *Deleted

## 2015-09-30 ENCOUNTER — Encounter (HOSPITAL_BASED_OUTPATIENT_CLINIC_OR_DEPARTMENT_OTHER)
Admission: RE | Disposition: A | Discharge status: Home / Self Care | Payer: Self-pay | Source: Ambulatory Visit | Attending: Orthopedic Surgery

## 2015-09-30 ENCOUNTER — Ambulatory Visit (HOSPITAL_BASED_OUTPATIENT_CLINIC_OR_DEPARTMENT_OTHER)
Admission: RE | Admit: 2015-09-30 | Discharge: 2015-10-01 | Disposition: A | Discharge status: Home / Self Care | Payer: Managed Care, Other (non HMO) | Source: Ambulatory Visit | Attending: Orthopedic Surgery | Admitting: Orthopedic Surgery

## 2015-09-30 DIAGNOSIS — M19042 Primary osteoarthritis, left hand: Secondary | ICD-10-CM | POA: Insufficient documentation

## 2015-09-30 DIAGNOSIS — Z880 Allergy status to penicillin: Secondary | ICD-10-CM | POA: Diagnosis not present

## 2015-09-30 DIAGNOSIS — M189 Osteoarthritis of first carpometacarpal joint, unspecified: Secondary | ICD-10-CM | POA: Diagnosis present

## 2015-09-30 HISTORY — PX: TENDON TRANSFER: SHX6109

## 2015-09-30 HISTORY — DX: Unspecified osteoarthritis, unspecified site: M19.90

## 2015-09-30 HISTORY — PX: FINGER ARTHROSCOPY WITH CARPOMETACARPEL (CMC) ARTHROPLASTY: SHX5629

## 2015-09-30 SURGERY — FINGER ARTHROSCOPY WITH CARPOMETACARPEL (CMC) ARTHROPLASTY
Anesthesia: General | Site: Thumb | Laterality: Left

## 2015-09-30 MED ORDER — SCOPOLAMINE 1 MG/3DAYS TD PT72: 1.0000 | MEDICATED_PATCH | Freq: Once | TRANSDERMAL | Status: DC

## 2015-09-30 MED ORDER — FAMOTIDINE 20 MG PO TABS: 20.0000 mg | ORAL_TABLET | Freq: Two times a day (BID) | ORAL | Status: DC | PRN

## 2015-09-30 MED ORDER — METHOCARBAMOL 500 MG PO TABS
500.0000 mg | ORAL_TABLET | Freq: Four times a day (QID) | ORAL | Status: DC | PRN
Start: 2015-09-30 — End: 2015-10-01

## 2015-09-30 MED ORDER — FENTANYL CITRATE (PF) 100 MCG/2ML IJ SOLN
INTRAMUSCULAR | Status: AC
  Filled 2015-09-30: qty 2

## 2015-09-30 MED ORDER — LACTATED RINGERS IV SOLN
INTRAVENOUS | Status: DC
  Administered 2015-09-30 (×2): via INTRAVENOUS

## 2015-09-30 MED ORDER — HYDROMORPHONE HCL 1 MG/ML IJ SOLN: 0.2500 mg | INTRAMUSCULAR | Status: DC | PRN

## 2015-09-30 MED ORDER — FENTANYL CITRATE (PF) 100 MCG/2ML IJ SOLN
INTRAMUSCULAR | Status: DC | PRN
  Administered 2015-09-30: 100 ug via INTRAVENOUS
  Administered 2015-09-30: 25 ug via INTRAVENOUS

## 2015-09-30 MED ORDER — METHOCARBAMOL 500 MG PO TABS: 500.0000 mg | ORAL_TABLET | Freq: Four times a day (QID) | ORAL | Status: AC | PRN

## 2015-09-30 MED ORDER — ONDANSETRON HCL 4 MG/2ML IJ SOLN
INTRAMUSCULAR | Status: DC | PRN
  Administered 2015-09-30: 4 mg via INTRAVENOUS

## 2015-09-30 MED ORDER — PROPOFOL 10 MG/ML IV BOLUS
INTRAVENOUS | Status: DC | PRN
  Administered 2015-09-30: 200 mg via INTRAVENOUS
  Administered 2015-09-30: 100 mg via INTRAVENOUS

## 2015-09-30 MED ORDER — MIDAZOLAM HCL 2 MG/2ML IJ SOLN
INTRAMUSCULAR | Status: AC
  Filled 2015-09-30: qty 2

## 2015-09-30 MED ORDER — BUPIVACAINE HCL (PF) 0.5 % IJ SOLN
INTRAMUSCULAR | Status: AC
  Filled 2015-09-30: qty 30

## 2015-09-30 MED ORDER — SCOPOLAMINE 1 MG/3DAYS TD PT72: 1.0000 | MEDICATED_PATCH | TRANSDERMAL | Status: DC

## 2015-09-30 MED ORDER — ZOLPIDEM TARTRATE 5 MG PO TABS
5.0000 mg | ORAL_TABLET | Freq: Every evening | ORAL | Status: DC | PRN
  Administered 2015-09-30: 5 mg via ORAL
  Filled 2015-09-30: qty 1

## 2015-09-30 MED ORDER — BUPIVACAINE HCL (PF) 0.25 % IJ SOLN
INTRAMUSCULAR | Status: AC
  Filled 2015-09-30: qty 30

## 2015-09-30 MED ORDER — VANCOMYCIN HCL IN DEXTROSE 1-5 GM/200ML-% IV SOLN
1000.0000 mg | INTRAVENOUS | Status: AC
  Administered 2015-09-30 (×2): 1000 mg via INTRAVENOUS

## 2015-09-30 MED ORDER — ONDANSETRON HCL 4 MG/2ML IJ SOLN
INTRAMUSCULAR | Status: AC
  Filled 2015-09-30: qty 2

## 2015-09-30 MED ORDER — DIPHENHYDRAMINE HCL 25 MG PO CAPS: 25.0000 mg | ORAL_CAPSULE | Freq: Four times a day (QID) | ORAL | Status: DC | PRN

## 2015-09-30 MED ORDER — PROPOFOL 10 MG/ML IV BOLUS
INTRAVENOUS | Status: AC
  Filled 2015-09-30: qty 40

## 2015-09-30 MED ORDER — PROMETHAZINE HCL 25 MG/ML IJ SOLN: 6.2500 mg | INTRAMUSCULAR | Status: DC | PRN

## 2015-09-30 MED ORDER — OXYCODONE HCL 5 MG PO TABS: 5.0000 mg | ORAL_TABLET | ORAL | Status: AC | PRN

## 2015-09-30 MED ORDER — VANCOMYCIN HCL IN DEXTROSE 1-5 GM/200ML-% IV SOLN
INTRAVENOUS | Status: AC
  Filled 2015-09-30: qty 200

## 2015-09-30 MED ORDER — DOCUSATE SODIUM 100 MG PO CAPS
100.0000 mg | ORAL_CAPSULE | Freq: Two times a day (BID) | ORAL | Status: DC
  Administered 2015-09-30: 100 mg via ORAL
  Filled 2015-09-30: qty 1

## 2015-09-30 MED ORDER — CHLORHEXIDINE GLUCONATE 4 % EX LIQD: 60.0000 mL | Freq: Once | CUTANEOUS | Status: DC

## 2015-09-30 MED ORDER — LIDOCAINE HCL (CARDIAC) 20 MG/ML IV SOLN
INTRAVENOUS | Status: DC | PRN
Start: 2015-09-30 — End: 2015-09-30
  Administered 2015-09-30 (×2): 50 mg via INTRAVENOUS

## 2015-09-30 MED ORDER — GLYCOPYRROLATE 0.2 MG/ML IJ SOLN: 0.2000 mg | Freq: Once | INTRAMUSCULAR | Status: DC | PRN

## 2015-09-30 MED ORDER — LACTATED RINGERS IV SOLN
INTRAVENOUS | Status: DC
  Administered 2015-09-30: 17:00:00 via INTRAVENOUS

## 2015-09-30 MED ORDER — METHOCARBAMOL 1000 MG/10ML IJ SOLN: 500.0000 mg | Freq: Four times a day (QID) | INTRAVENOUS | Status: DC | PRN

## 2015-09-30 MED ORDER — BUPIVACAINE-EPINEPHRINE (PF) 0.5% -1:200000 IJ SOLN
INTRAMUSCULAR | Status: DC | PRN
  Administered 2015-09-30: 30 mL via PERINEURAL

## 2015-09-30 MED ORDER — LIDOCAINE-EPINEPHRINE (PF) 1 %-1:200000 IJ SOLN
INTRAMUSCULAR | Status: AC
  Filled 2015-09-30: qty 30

## 2015-09-30 MED ORDER — HYDROMORPHONE HCL 2 MG PO TABS
1.0000 mg | ORAL_TABLET | ORAL | Status: DC | PRN
  Administered 2015-10-01 (×2): 2 mg via ORAL
  Filled 2015-09-30 (×2): qty 1

## 2015-09-30 MED ORDER — MIDAZOLAM HCL 2 MG/2ML IJ SOLN
1.0000 mg | INTRAMUSCULAR | Status: DC | PRN
  Administered 2015-09-30: 2 mg via INTRAVENOUS

## 2015-09-30 MED ORDER — OXYCODONE HCL 5 MG PO TABS
5.0000 mg | ORAL_TABLET | ORAL | Status: DC | PRN
  Administered 2015-09-30: 5 mg via ORAL
  Filled 2015-09-30: qty 1

## 2015-09-30 MED ORDER — MIDAZOLAM HCL 5 MG/5ML IJ SOLN
INTRAMUSCULAR | Status: DC | PRN
Start: 2015-09-30 — End: 2015-09-30
  Administered 2015-09-30: 2 mg via INTRAVENOUS

## 2015-09-30 MED ORDER — DEXAMETHASONE SODIUM PHOSPHATE 10 MG/ML IJ SOLN
INTRAMUSCULAR | Status: AC
  Filled 2015-09-30: qty 1

## 2015-09-30 MED ORDER — VANCOMYCIN HCL IN DEXTROSE 1-5 GM/200ML-% IV SOLN: 1000.0000 mg | Freq: Once | INTRAVENOUS | Status: DC

## 2015-09-30 MED ORDER — ALPRAZOLAM 0.5 MG PO TABS
0.5000 mg | ORAL_TABLET | Freq: Four times a day (QID) | ORAL | Status: DC | PRN
  Administered 2015-09-30: 0.5 mg via ORAL
  Filled 2015-09-30: qty 2

## 2015-09-30 MED ORDER — LIDOCAINE HCL (CARDIAC) 20 MG/ML IV SOLN
INTRAVENOUS | Status: AC
  Filled 2015-09-30: qty 5

## 2015-09-30 MED ORDER — AMITRIPTYLINE HCL 25 MG PO TABS: 25.0000 mg | ORAL_TABLET | Freq: Every day | ORAL | Status: DC

## 2015-09-30 MED ORDER — FENTANYL CITRATE (PF) 100 MCG/2ML IJ SOLN
50.0000 ug | INTRAMUSCULAR | Status: DC | PRN
  Administered 2015-09-30: 100 ug via INTRAVENOUS

## 2015-09-30 MED ORDER — VITAMIN C 500 MG PO TABS: 1000.0000 mg | ORAL_TABLET | Freq: Every day | ORAL | Status: DC

## 2015-09-30 MED ORDER — DEXAMETHASONE SODIUM PHOSPHATE 4 MG/ML IJ SOLN
INTRAMUSCULAR | Status: DC | PRN
  Administered 2015-09-30: 1 mg via INTRAVENOUS
  Administered 2015-09-30: 10 mg via INTRAVENOUS

## 2015-09-30 MED ORDER — HYDROMORPHONE HCL 1 MG/ML IJ SOLN: 0.5000 mg | INTRAMUSCULAR | Status: DC | PRN

## 2015-09-30 SURGICAL SUPPLY — 72 items
ANCHOR SUTBIO SWIVELK 3.5X15.8 (Anchor) ×3 IMPLANT
BANDAGE ACE 3X5.8 VEL STRL LF (GAUZE/BANDAGES/DRESSINGS) ×6 IMPLANT
BIT DRILL CANN 2.7X625 NONSTRL (BIT) ×3 IMPLANT
BIT DRILL CANN 3.5X160 QC (BIT) ×3 IMPLANT
BLADE CLIPPER SURG (BLADE) ×3 IMPLANT
BLADE OSC/SAG .038X5.5 CUT EDG (BLADE) IMPLANT
BLADE SURG 15 STRL LF DISP TIS (BLADE) ×3 IMPLANT
BLADE SURG 15 STRL SS (BLADE) ×6
BNDG CONFORM 3 STRL LF (GAUZE/BANDAGES/DRESSINGS) ×3 IMPLANT
BNDG GAUZE ELAST 4 BULKY (GAUZE/BANDAGES/DRESSINGS) ×3 IMPLANT
BRUSH SCRUB EZ PLAIN DRY (MISCELLANEOUS) ×3 IMPLANT
CANISTER SUCT 1200ML W/VALVE (MISCELLANEOUS) ×3 IMPLANT
CORDS BIPOLAR (ELECTRODE) ×3 IMPLANT
COVER BACK TABLE 60X90IN (DRAPES) ×3 IMPLANT
CUFF TOURNIQUET SINGLE 18IN (TOURNIQUET CUFF) IMPLANT
DECANTER SPIKE VIAL GLASS SM (MISCELLANEOUS) IMPLANT
DRAIN JACKSON RD 7FR 3/32 (WOUND CARE) IMPLANT
DRAPE EXTREMITY T 121X128X90 (DRAPE) ×3 IMPLANT
DRAPE SURG 17X23 STRL (DRAPES) ×3 IMPLANT
DRSG EMULSION OIL 3X3 NADH (GAUZE/BANDAGES/DRESSINGS) ×3 IMPLANT
GAUZE SPONGE 4X4 16PLY XRAY LF (GAUZE/BANDAGES/DRESSINGS) IMPLANT
GLOVE BIO SURGEON STRL SZ 6.5 (GLOVE) ×2 IMPLANT
GLOVE BIO SURGEON STRL SZ8 (GLOVE) ×3 IMPLANT
GLOVE BIO SURGEONS STRL SZ 6.5 (GLOVE) ×1
GLOVE BIOGEL M STRL SZ7.5 (GLOVE) IMPLANT
GLOVE BIOGEL PI IND STRL 7.0 (GLOVE) ×2 IMPLANT
GLOVE BIOGEL PI INDICATOR 7.0 (GLOVE) ×4
GLOVE SS BIOGEL STRL SZ 8 (GLOVE) ×1 IMPLANT
GLOVE SUPERSENSE BIOGEL SZ 8 (GLOVE) ×2
GOWN STRL REUS W/ TWL LRG LVL3 (GOWN DISPOSABLE) ×1 IMPLANT
GOWN STRL REUS W/ TWL XL LVL3 (GOWN DISPOSABLE) ×1 IMPLANT
GOWN STRL REUS W/TWL LRG LVL3 (GOWN DISPOSABLE) ×2
GOWN STRL REUS W/TWL XL LVL3 (GOWN DISPOSABLE) ×2
GUIDEWIRE THREADED 150MM (WIRE) ×3 IMPLANT
KIT ASCP FXDISP 3X8XBTNDS (KITS) ×1 IMPLANT
KIT BIO-TENODESIS 3X8 DISP (KITS) ×2
NEEDLE HYPO 22GX1.5 SAFETY (NEEDLE) IMPLANT
NEEDLE HYPO 25X1 1.5 SAFETY (NEEDLE) ×3 IMPLANT
NS IRRIG 1000ML POUR BTL (IV SOLUTION) ×3 IMPLANT
PACK BASIN DAY SURGERY FS (CUSTOM PROCEDURE TRAY) ×3 IMPLANT
PAD CAST 3X4 CTTN HI CHSV (CAST SUPPLIES) ×2 IMPLANT
PADDING CAST ABS 3INX4YD NS (CAST SUPPLIES) ×2
PADDING CAST ABS 4INX4YD NS (CAST SUPPLIES) ×2
PADDING CAST ABS COTTON 3X4 (CAST SUPPLIES) ×1 IMPLANT
PADDING CAST ABS COTTON 4X4 ST (CAST SUPPLIES) ×1 IMPLANT
PADDING CAST COTTON 3X4 STRL (CAST SUPPLIES) ×4
PASSER SUT SWANSON 36MM LOOP (INSTRUMENTS) IMPLANT
SHEET MEDIUM DRAPE 40X70 STRL (DRAPES) ×3 IMPLANT
SPLINT FIBERGLASS 3X35 (CAST SUPPLIES) ×3 IMPLANT
SPLINT PLASTER CAST XFAST 3X15 (CAST SUPPLIES) IMPLANT
SPLINT PLASTER XTRA FASTSET 3X (CAST SUPPLIES)
STOCKINETTE 4X48 STRL (DRAPES) ×3 IMPLANT
STOCKINETTE SYNTHETIC 3 UNSTER (CAST SUPPLIES) ×3 IMPLANT
SUCTION FRAZIER HANDLE 10FR (MISCELLANEOUS) ×2
SUCTION TUBE FRAZIER 10FR DISP (MISCELLANEOUS) ×1 IMPLANT
SUT BONE WAX W31G (SUTURE) IMPLANT
SUT FIBERWIRE 3-0 18 TAPR NDL (SUTURE)
SUT FIBERWIRE 4-0 18 TAPR NDL (SUTURE) ×6
SUT PROLENE 4 0 PS 2 18 (SUTURE) ×3 IMPLANT
SUT VIC AB 4-0 P-3 18XBRD (SUTURE) IMPLANT
SUT VIC AB 4-0 P3 18 (SUTURE)
SUTURE FIBERWR 3-0 18 TAPR NDL (SUTURE) IMPLANT
SUTURE FIBERWR 4-0 18 TAPR NDL (SUTURE) ×2 IMPLANT
SYR BULB 3OZ (MISCELLANEOUS) ×3 IMPLANT
SYR CONTROL 10ML LL (SYRINGE) ×6 IMPLANT
TAPE SURG TRANSPORE 1 IN (GAUZE/BANDAGES/DRESSINGS) ×1 IMPLANT
TAPE SURGICAL TRANSPORE 1 IN (GAUZE/BANDAGES/DRESSINGS) ×2
TOWEL OR 17X24 6PK STRL BLUE (TOWEL DISPOSABLE) ×6 IMPLANT
TOWEL OR NON WOVEN STRL DISP B (DISPOSABLE) ×3 IMPLANT
TUBE CONNECTING 20'X1/4 (TUBING) ×1
TUBE CONNECTING 20X1/4 (TUBING) ×2 IMPLANT
UNDERPAD 30X30 (UNDERPADS AND DIAPERS) ×3 IMPLANT

## 2015-09-30 NOTE — Progress Notes (Signed)
ANTIBIOTIC CONSULT NOTE - INITIAL  Pharmacy Consult for Vancomycin Indication: Post-op hang surgery  Allergies  Allergen Reactions  . Amoxicillin Other (See Comments)    Yeast infections   . Penicillins Other (See Comments)    Yeast infections   Patient Measurements: Height: 5\' 4"  (162.6 cm) Weight: 188 lb 4 oz (85.39 kg) IBW/kg (Calculated) : 54.7 Vital Signs: Temp: 97.3 F (36.3 C) (01/13 1430) Temp Source: Oral (01/13 0633) BP: 147/92 mmHg (01/13 1430) Pulse Rate: 99 (01/13 1430) Intake/Output from this shift: Total I/O In: 2020 [P.O.:720; I.V.:1300] Out: 2700 [Urine:2700]  Labs: No results for input(s): WBC, HGB, PLT, LABCREA, CREATININE in the last 72 hours. CrCl cannot be calculated (Patient has no serum creatinine result on file.). Microbiology: No results found for this or any previous visit (from the past 720 hour(s)). Medical History: Past Medical History  Diagnosis Date  . Wears glasses   . Anxiety   . DJD (degenerative joint disease)     left thumb   Medications:  Anti-infectives    Start     Dose/Rate Route Frequency Ordered Stop   09/30/15 0620  vancomycin (VANCOCIN) IVPB 1000 mg/200 mL premix     1,000 mg 200 mL/hr over 60 Minutes Intravenous On call to O.R. 09/30/15 [id number] 09/30/15 [id number]     Assessment: 55 year old patient s/p hand surgery today who received vancomycin perioperatively (total of 2gm) and now to receive vancomycin post-operatively per Dr. Amanda PeaGramig. [facility] she [facility] and will be discharged in AM. Patient was afebrile prior to surgery. No labs are available in EPIC- no documented history of any type of renal injury or disease. Called and discussed with RN who confirmed that Dr. Amanda PeaGramig wants vancomycin dose in AM (1 gram) prior to the patient discharging.   Plan:  Vancomycin 1g IV x1 dose at 0600 AM on 10/01/15 per telephone order from Dr. Amanda PeaGramig.  Pharmacy will sign off.    Link SnufferJessica Lemond Griffee, PharmD, BCPS Clinical Pharmacist [phone]  09/30/2015,4:38 PM

## 2015-09-30 NOTE — Anesthesia Postprocedure Evaluation (Signed)
Anesthesia Post Note  Patient: Catherine Cruz  Procedure(s) Performed: Procedure(s) (LRB): LEFT THUMB CARPOMETACARPAL (CMC) ARTHROPLASTY (Left) LEFT THUMB DOUBLE TENDON TRANSFER, REPAIR RECONSTRUCTION  (Left)  Patient location during evaluation: PACU Anesthesia Type: General Level of consciousness: sedated Pain management: pain level controlled Vital Signs Assessment: post-procedure vital signs reviewed and stable Respiratory status: spontaneous breathing and respiratory function stable Cardiovascular status: stable Anesthetic complications: no    Last Vitals:  Filed Vitals:   09/30/15 1015 09/30/15 1039  BP:  160/89  Pulse: 79 80  Temp:  36.3 C  Resp: 16 16    Last Pain:  Filed Vitals:   09/30/15 1102  PainSc: 0-No pain                 Darryl Willner DANIEL

## 2015-09-30 NOTE — Op Note (Signed)
See dictation#[id number] Jesus Poplin MD

## 2015-09-30 NOTE — Transfer of Care (Signed)
Immediate Anesthesia Transfer of Care Note  Patient: Catherine Cruz  Procedure(s) Performed: Procedure(s) with comments: LEFT THUMB CARPOMETACARPAL (CMC) ARTHROPLASTY (Left) - ANESTHESIA: GENERAL/BLOCK LEFT THUMB DOUBLE TENDON TRANSFER, REPAIR RECONSTRUCTION  (Left)  Patient Location: PACU  Anesthesia Type:GA combined with regional for post-op pain  Level of Consciousness: awake and patient cooperative  Airway & Oxygen Therapy: Patient Spontanous Breathing and Patient connected to face mask oxygen  Post-op Assessment: Report given to RN and Post -op Vital signs reviewed and stable  Post vital signs: Reviewed and stable  Last Vitals:  Filed Vitals:   09/30/15 0714 09/30/15 0715  BP:    Pulse: 98 100  Temp:    Resp: 19 21    Complications: No apparent anesthesia complications

## 2015-09-30 NOTE — Anesthesia Procedure Notes (Addendum)
Anesthesia Regional Block:  Supraclavicular block  Pre-Anesthetic Checklist: ,, timeout performed, Correct Patient, Correct Site, Correct Laterality, Correct Procedure, Correct Position, site marked, Risks and benefits discussed,  Surgical consent,  Pre-op evaluation,  At surgeon's request and post-op pain management  Laterality: Left  Prep: chloraprep       Needles:  Injection technique: Single-shot  Needle Type: Echogenic Stimulator Needle     Needle Length: 5cm 5 cm Needle Gauge: 22 and 22 G    Additional Needles:  Procedures: ultrasound guided (picture in chart) and nerve stimulator Supraclavicular block  Nerve Stimulator or Paresthesia:  Response: bicep contraction, 0.45 mA,   Additional Responses:   Narrative:  Start time: 09/30/2015 6:54 AM End time: 09/30/2015 7:04 AM Injection made incrementally with aspirations every 5 mL.  Performed by: Personally  Anesthesiologist: Heather RobertsSINGER, JAMES  Additional Notes: Functioning IV was confirmed and monitors applied.  A 50mm 22ga echogenic arrow stimulator was used. Sterile prep and drape,hand hygiene and sterile gloves were used.Ultrasound guidance: relevant anatomy identified, needle position confirmed, local anesthetic spread visualized around nerve(s)., vascular puncture avoided.  Image printed for medical record.  Negative aspiration and negative test dose prior to incremental administration of local anesthetic. The patient tolerated the procedure well.   Procedure Name: LMA Insertion Date/Time: 09/30/2015 7:47 AM Performed by: Genevieve NorlanderLINKA, Willowdean Luhmann L Pre-anesthesia Checklist: Patient identified, Emergency Drugs available, Suction available, Patient being monitored and Timeout performed Patient Re-evaluated:Patient Re-evaluated prior to inductionOxygen Delivery Method: Circle System Utilized Preoxygenation: Pre-oxygenation with 100% oxygen Intubation Type: IV induction Ventilation: Mask ventilation without difficulty LMA: LMA  inserted LMA Size: 4.0 Number of attempts: 1 Airway Equipment and Method: Bite block Placement Confirmation: positive ETCO2 Tube secured with: Tape Dental Injury: Teeth and Oropharynx as per pre-operative assessment

## 2015-09-30 NOTE — H&P (Signed)
Catherine Cruz is an 55 y.o. female.   Chief Complaint: L CMC DJD HPI: Patient presents for evaluation and treatment of the of their upper extremity predicament. The patient denies neck, back, chest or  abdominal pain. The patient notes that they have no lower extremity problems. The patients primary complaint is noted. We are planning surgical care pathway for the upper extremity.  Past Medical History  Diagnosis Date  . Wears glasses   . Anxiety   . DJD (degenerative joint disease)     left thumb    Past Surgical History  Procedure Laterality Date  . Abdominal hysterectomy  1985  . Dilation and curettage of uterus    . Laparoscopic oophorectomy  1988    loa  . Wisdom tooth extraction  1998  . Carpal tunnel release  1989    left  . Carpometacarpal (cmc) fusion of thumb Right 02/26/2014    Procedure: RIGHT CARPOMETACARPAL (CMC) ARTHROPLASTY OF THUMB/ZANCOLLI ARTHROPLASTY/LEFT THUMB CMC INJECTION;  Surgeon: Dominica SeverinWilliam Kwana Ringel, MD;  Location: [facility];  Service: Orthopedics;  Laterality: Right;  ANESTHESIA: GENERAL WITH BLOCK    History reviewed. No pertinent family history. Social History:  reports that she has never smoked. She does not have any smokeless tobacco history on file. She reports that she does not drink alcohol or use illicit drugs.  Allergies:  Allergies  Allergen Reactions  . Amoxicillin Other (See Comments)    Yeast infections   . Penicillins Other (See Comments)    Yeast infections     Medications Prior to Admission  Medication Sig Dispense Refill  . ALPRAZolam (XANAX) 1 MG tablet One half to one at bedtime if needed for anxiety 30 tablet 1  . amitriptyline (ELAVIL) 25 MG tablet Take 25 mg by mouth at bedtime.    . lovastatin (MEVACOR) 20 MG tablet Take 20 mg by mouth at bedtime.      No results found for this or any previous visit (from the past 48 hour(s)). No results found.  Review of Systems  Constitutional: Negative.   HENT:  Negative.   Eyes: Negative.   Respiratory: Negative.   Gastrointestinal: Negative.   Genitourinary: Negative.   Skin: Negative.   Neurological: Negative.     Blood pressure 130/81, pulse 100, temperature 98 F (36.7 C), temperature source Oral, resp. rate 21, height 5\' 4"  (1.626 m), weight 85.39 kg (188 lb 4 oz), SpO2 99 %. Physical Exam  Lt CMC DJD  The patient is alert and oriented in no acute distress. The patient complains of pain in the affected upper extremity.  The patient is noted to have a normal HEENT exam. Lung fields show equal chest expansion and no shortness of breath. Abdomen exam is nontender without distention. Lower extremity examination does not show any fracture dislocation or blood clot symptoms. Pelvis is stable and the neck and back are stable and nontender. Assessment/Plan L CMC DJD plan for reconstruction  We are planning surgery for your upper extremity. The risk and benefits of surgery to include risk of bleeding, infection, anesthesia,  damage to normal structures and failure of the surgery to accomplish its intended goals of relieving symptoms and restoring function have been discussed in detail. With this in mind we plan to proceed. I have specifically discussed with the patient the pre-and postoperative regime and the dos and don'ts and risk and benefits in great detail. Risk and benefits of surgery also include risk of dystrophy(CRPS), chronic nerve pain, failure of the healing process  to go onto completion and other inherent risks of surgery The relavent the pathophysiology of the disease/injury process, as well as the alternatives for treatment and postoperative course of action has been discussed in great detail with the patient who desires to proceed.  We will do everything in our power to help you (the patient) restore function to the upper extremity. It is a pleasure to see this patient today.   Karen Chafe 09/30/2015, 7:41 AM

## 2015-09-30 NOTE — Progress Notes (Signed)
Assisted Dr. Singer with left, ultrasound guided, supraclavicular block. Side rails up, monitors on throughout procedure. See vital signs in flow sheet. Tolerated Procedure well. 

## 2015-09-30 NOTE — Op Note (Signed)
NAMMaryclare Cruz:  Escorcia, Shakura           ACCOUNT NO.:  [id number]  MEDICAL RECORD NO.:  [id number]  LOCATION:                                 FACILITY:  PHYSICIAN:  Dionne AnoWilliam M. Miquan Tandon, M.D.     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  PROCEDURE: 1. Left thumb carpometacarpal arthroplasty (removal of the trapezium     at the base of thumb joint region secondary to chronic longstanding     arthritis). 2. Abductors pollicis longus 1/3 proper portion tendon transfer to the     first metacarpal FCR and back upon itself and the metacarpal     (Zancolli tendon transfer) left basilar thumb joint. 3. Abductor pollicis longus, digastric portion tendon transfer to the     FCR, APL, and back upon themselves with multiple figure-of-eight     throws (Weilby tendon transfer) left basilar thumb joint. 4. Abductor pollicis longus tenodesis (shortening of the wrist     extensor to __________ level to prevent dorsolateral escape.).  SURGEON:  Dionne AnoWilliam M. Amanda PeaGramig, MD  ASSISTANT:  None.  COMPLICATIONS:  None.  ANESTHESIA:  General preoperative block.  TOURNIQUET TIME:  Less than an hour.  INDICATIONS:  This patient is a 55 year old female, who presents with the above-mentioned diagnosis.  I have counseled her in regard to risks and benefits of surgery and she desires to proceed with the above- mentioned operative intervention.  All questions have been encouraged and answered and addressed.  OPERATIVE PROCEDURE:  The patient was seen by myself and Anesthesia, taken to operative theater, underwent a smooth induction of general anesthetic.  Preoperative  block was placed.  Vancomycin was given as she has a PENICILLIN allergy.  She was prepped and draped.  Time-out was called.  Incision was made dorsal midline about the thumb.  Dissection was carried down.  Superficial radial nerve was identified.  It had some uncharacteristic branching that we evaluated and dealt with very careful and general retraction throughout the case.  I identified the radial artery.  The EPB and APL, sheaths were released and following this, I made an incision in the [facility] capsule.  I then incised the capsule circumferentially and removed the trapezium.  Following this, FCR tenolysis, tenosynovectomy was accomplished.  Once this was accomplished, we then made a drill hole dorsal to palmar extending intra- articularly in line with palmar beak ligament from the metacarpal dorsal palmar.  This completed the arthroplasty portion of procedure.  Following this, I harvested the APL digastric portion and APL 1/3rd proper portion from a counter incision at the distal dorsal 3rd of the forearm.  Dissection was carried down.  These areas were carefully evaluated, tendons harvested and retracted through the main wound.  Once this was done, FiberLoop was placed around the 2 ends of the tendons distally in the main wound.  At this time, I performed APL 1/3rd proper portion tendon transfer to the first metacarpal through  the drill hole dorsal palmar around the FCR twice and back through itself and into the drill hole secured with FiberWire and a 3.5, Bio-Tenodesis screw from [employer].  Following this, which completed the Zancolli tendon transfer, we then performed a 2nd tendon transfer which was the [facility] tendon transfer.  The APL digastric portion was transferred to the FCR, then around the APL proper and with multiple figure-of-eight throws was weaved and then secured with FiberWire.  This completed the [facility] tendon transfer.  Following this, we performed a shortening of  wrist extensor to __________ level and then performed APL tenodesis.  This was done to prevent dorsolateral escape and was performed with FiberWire suture.  Following this, the thumb looked excellently suspended, had no problems or complications.  We at this time, performed careful irrigation, tourniquet deflation, closed the capsule with a buried FiberWire stitch of the 4 variety and then ultimately closed wound with 4-0 Prolene superficial radial nerve and radial artery looked excellent.  There were no complicating features.  Her superficial radial nerve did have some very interesting branching; however we were able to preserve this.  The superficial radial nerve had more of an ulnarly based location in the radial sensory innervation branches had a very distal takeoff, thus we managed this appropriately with retraction throughout the case.  These notes have been discussed. All questions have been encouraged and answered.  She will be admitted overnight.  We placed her in a standard postop dressing, and we will proceed according to our standard postop protocol of course.  This was an uncomplicated CMC arthroplasty.     Dionne Ano. Amanda Pea, M.D.     [facility]  D:  09/30/2015  T:  09/30/2015  Job:  [id number]

## 2015-10-01 DIAGNOSIS — M19042 Primary osteoarthritis, left hand: Secondary | ICD-10-CM | POA: Diagnosis not present

## 2015-10-01 NOTE — Discharge Instructions (Signed)
Keep bandage clean and dry.  Call for any problems.  No smoking.  Criteria for driving a car: you should be off your pain medicine for 7-8 hours, able to drive one handed(confident), thinking clearly and feeling able in your judgement to drive. °Continue elevation as it will decrease swelling.  If instructed by MD move your fingers within the confines of the bandage/splint.  Use ice if instructed by your MD. Call immediately for any sudden loss of feeling in your hand/arm or change in functional abilities of the extremity.We recommend that you to take vitamin C 1000 mg a day to promote healing. °We also recommend that if you require  pain medicine that you take a stool softener to prevent constipation as most pain medicines will have constipation side effects. We recommend either Peri-Colace or Senokot and recommend that you also consider adding MiraLAX to prevent the constipation affects from pain medicine if you are required to use them. These medicines are over the counter and maybe purchased at a local pharmacy. A cup of yogurt and a probiotic can also be helpful during the recovery process as the medicines can disrupt your intestinal environment. ° °Regional Anesthesia Blocks ° °1. Numbness or the inability to move the "blocked" extremity may last from 3-48 hours after placement. The length of time depends on the medication injected and your individual response to the medication. If the numbness is not going away after 48 hours, call your surgeon. ° °2. The extremity that is blocked will need to be protected until the numbness is gone and the  Strength has returned. Because you cannot feel it, you will need to take extra care to avoid injury. Because it may be weak, you may have difficulty moving it or using it. You may not know what position it is in without looking at it while the block is in effect. ° °3. For blocks in the legs and feet, returning to weight bearing and walking needs to be done carefully. You  will need to wait until the numbness is entirely gone and the strength has returned. You should be able to move your leg and foot normally before you try and bear weight or walk. You will need someone to be with you when you first try to ensure you do not fall and possibly risk injury. ° °4. Bruising and tenderness at the needle site are common side effects and will resolve in a few days. ° °5. Persistent numbness or new problems with movement should be communicated to the surgeon or the [facility] ([phone])/ [facility] ([phone]). °

## 2015-10-01 NOTE — Discharge Summary (Signed)
  Patient has been seen and examined. Patient has pain appropriate to his injury/process. Patient denies new complaints at this present time. I have discussed the care pathway with nursing staff. Patient is appropriate and alert.  We reviewed vital signs and intake output which are stable.  The upper extremity is neurovascularly intact. Refill is normal. There is no signs of compartment syndrome. There is no signs of dystrophy. There is normal sensation.  I have spent a  great deal of time discussing range of motion edema control and other techniques to decrease edema and promote flexion extension of the fingers. Patient understands the importance of elevation range of motion massage and other measures to lessen pain and prevent swelling.  We have also discussed immobilization to appropriate areas involved.  We have discussed with the patient shoulder range of motion to prevent adhesive capsulitis.  The remainder of the examination is normal today without complicating feature.    Patient will be discharged home. Will plan to see the patient back in the office as per discharge instructions (please see discharge instructions).  Patient had an uneventful hospital course. At the time of discharge patient is stable awake alert and oriented in no acute distress. Regular diet will be continued and has been tolerated. Patient will notify should have problems occur. There is no signs of DVT infection or other complication at this juncture.  All questions have been incurred and answered. Meds written-Dilaudid xanax and Remus Lofflerambien Dx SP CMC Aplasty L Vena Austriahumb  Azlyn Wingler MD

## 2015-10-03 NOTE — Addendum Note (Signed)
Addendum  created 10/03/15 0950 by Lance CoonWesley Autumm Hattery, CRNA   Modules edited: Charges VN

## 2015-10-04 ENCOUNTER — Encounter (HOSPITAL_BASED_OUTPATIENT_CLINIC_OR_DEPARTMENT_OTHER): Payer: Self-pay | Admitting: Orthopedic Surgery

## 2016-03-14 ENCOUNTER — Other Ambulatory Visit: Payer: Self-pay | Admitting: Internal Medicine

## 2016-03-14 DIAGNOSIS — Z1231 Encounter for screening mammogram for malignant neoplasm of breast: Secondary | ICD-10-CM

## 2017-04-05 ENCOUNTER — Emergency Department (HOSPITAL_COMMUNITY)
Admission: EM | Admit: 2017-04-05 | Discharge: 2017-04-05 | Disposition: A | Discharge status: Home / Self Care | Payer: No Typology Code available for payment source | Attending: Emergency Medicine | Admitting: Emergency Medicine

## 2017-04-05 DIAGNOSIS — F419 Anxiety disorder, unspecified: Secondary | ICD-10-CM | POA: Insufficient documentation

## 2017-04-05 MED ORDER — HYDROXYZINE HCL 25 MG PO TABS: 25.0000 mg | ORAL_TABLET | Freq: Four times a day (QID) | ORAL | 0 refills | Status: DC

## 2017-04-05 NOTE — ED Notes (Signed)
Pt reports having an anxiety attack since yesterday.  Has hx, takes xanax but she has not had it for 3 weeks, because she needs to see her doctor.  Pt reports acid reflux when she has an attack.  She reports that her doctor told her that she do not have any acid problems, that it's just her nerves that makes her think she does.  Pt is A&Ox 4.  Appears calm, but reports feeling anxious.

## 2017-04-05 NOTE — ED Provider Notes (Signed)
[facility]-EMERGENCY DEPT Provider Note   CSN: [id number] Arrival date & time: 04/05/17  1633   By signing my name below, I, Catherine Cruz, attest that this documentation has been prepared under the direction and in the presence of [name], PA-C. Electronically signed, Catherine Cruz, ED Scribe. 04/05/17. 6:45 PM.  History   Chief Complaint Chief Complaint  Patient presents with  . Medication Refill  . Anxiety   The history is provided by the patient and medical records. No language interpreter was used.    Catherine Cruz is a 56 y.o. female presenting to the Emergency Department concerning onset of a panic attack earlier today. She describes an episode of hyperventilation, anxiety, and a "stomach acid" discomfort in her throat. This resolved completely after a few minutes. Consistent with past panic attacks. States she has been out of her xanax for the past three weeks. She plans to see her PCP on Monday, July 23. She is requesting "something for nerves." She was taking Xanax 1 mg twice a day as needed. Denies current symptoms. Denies chest pain, shortness of breath, vomiting, fever, dizziness, or any other complaints.   Past Medical History:  Diagnosis Date  . Anxiety   . DJD (degenerative joint disease)    left thumb  . Wears glasses     Patient Active Problem List   Diagnosis Date Noted  . 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 ABDOMINAL HYSTERECTOMY  1985  . CARPAL TUNNEL RELEASE  1989   left  . CARPOMETACARPAL (CMC) FUSION OF THUMB Right 02/26/2014   Procedure: RIGHT CARPOMETACARPAL (CMC) ARTHROPLASTY OF THUMB/ZANCOLLI ARTHROPLASTY/LEFT THUMB CMC INJECTION;  Surgeon: Dominica SeverinWilliam Gramig, MD;  Location: [facility];  Service: Orthopedics;  Laterality: Right;  ANESTHESIA: GENERAL WITH BLOCK  . DILATION AND CURETTAGE OF UTERUS    . FINGER ARTHROSCOPY WITH CARPOMETACARPEL [facility](CMC) ARTHROPLASTY Left  09/30/2015   Procedure: LEFT THUMB CARPOMETACARPAL (CMC) ARTHROPLASTY;  Surgeon: Dominica SeverinWilliam Gramig, MD;  Location: [facility];  Service: Orthopedics;  Laterality: Left;  ANESTHESIA: GENERAL/BLOCK  . LAPAROSCOPIC OOPHORECTOMY  1988   loa  . TENDON TRANSFER Left 09/30/2015   Procedure: LEFT THUMB DOUBLE TENDON TRANSFER, REPAIR RECONSTRUCTION ;  Surgeon: Dominica SeverinWilliam Gramig, MD;  Location: [facility];  Service: Orthopedics;  Laterality: Left;  . WISDOM TOOTH EXTRACTION  1998    OB History    No data available       Home Medications    Prior to Admission medications   Medication Sig Start Date End Date Taking? Authorizing Provider  ALPRAZolam Prudy Feeler(XANAX) 1 MG tablet One half to one at bedtime if needed for anxiety 09/22/13  Yes Peyton NajjarHopper, David H, MD  amitriptyline (ELAVIL) 25 MG tablet Take 25 mg by mouth at bedtime.   Yes [provider]  hydrochlorothiazide (HYDRODIURIL) 25 MG tablet Take 25 mg by mouth daily.   Yes [provider]  lovastatin (MEVACOR) 20 MG tablet Take 20 mg by mouth at bedtime.   Yes [provider]  hydrOXYzine (ATARAX/VISTARIL) 25 MG tablet Take 1 tablet (25 mg total) by mouth every 6 (six) hours. 04/05/17   Butler Vegh C, PA-C  methocarbamol (ROBAXIN) 500 MG tablet Take 1 tablet (500 mg total) by mouth every 6 (six) hours as needed for muscle spasms. Patient not taking: Reported on 04/05/2017 09/30/15   Karie ChimeraBuchanan, Brian, PA-C  oxyCODONE (OXY IR/ROXICODONE) 5 MG immediate release tablet Take 1-2 tablets (5-10 mg total)  by mouth every 4 (four) hours as needed for moderate pain. Patient not taking: Reported on 04/05/2017 09/30/15   Karie Chimera, PA-C    Family History No family history on file.  Social History Social History  Substance Use Topics  . Smoking status: Never Smoker  . Smokeless tobacco: Not on file  . Alcohol use No     Allergies   Amoxicillin and Penicillins   Review of Systems Review of Systems    Constitutional: Negative for fever.  HENT: Negative for trouble swallowing.   Respiratory: Negative for shortness of breath.   Cardiovascular: Negative for chest pain.  Gastrointestinal: Negative for abdominal pain, nausea and vomiting.  Neurological: Negative for dizziness, syncope, weakness, light-headedness and numbness.  Psychiatric/Behavioral: The patient is nervous/anxious.   All other systems reviewed and are negative.    Physical Exam Updated Vital Signs BP (!) 150/107 (BP Location: Right Arm)   Pulse 92   Temp 98.1 F (36.7 C) (Oral)   Resp 18   SpO2 99%   Physical Exam  Constitutional: She appears well-developed and well-nourished. No distress.  HENT:  Head: Normocephalic and atraumatic.  Eyes: Conjunctivae are normal.  Neck: Neck supple.  Cardiovascular: Normal rate, regular rhythm, normal heart sounds and intact distal pulses.   Pulmonary/Chest: Effort normal and breath sounds normal. No respiratory distress.  Abdominal: Soft. There is no tenderness. There is no guarding.  Musculoskeletal: She exhibits no edema.  Lymphadenopathy:    She has no cervical adenopathy.  Neurological: She is alert.  Skin: Skin is warm and dry. She is not diaphoretic.  Psychiatric: She has a normal mood and affect. Her behavior is normal.  Nursing note and vitals reviewed.    ED Treatments / Results  DIAGNOSTIC STUDIES: Oxygen Saturation is 99% on RA, NL by my interpretation.    COORDINATION OF CARE: 6:38 PM-Discussed next steps with pt. Pt verbalized understanding and is agreeable with the plan. Will Rx hydralazine. Pt prepared for d/c, advised of symptomatic care at home, F/U instructions and return precautions.   Labs (all labs ordered are listed, but only abnormal results are displayed) Labs Reviewed - No data to display  EKG  EKG Interpretation None       Radiology No results found.  Procedures Procedures (including critical care time)  Medications Ordered in  ED Medications - No data to display   Initial Impression / Assessment and Plan / ED Course  I have reviewed the triage vital signs and the nursing notes.  Pertinent labs & imaging results that were available during my care of the patient were reviewed by me and considered in my medical decision making (see chart for details).     Patient presents with complaint of panic attack prior to arrival. Requesting medications. Patient was informed that we would not be able to fill her controlled substance medication, but she was accepting of alternatives. PCP follow-up. Return precautions discussed.  Final Clinical Impressions(s) / ED Diagnoses   Final diagnoses:  Anxiety    New Prescriptions Discharge Medication List as of 04/05/2017  6:49 PM    START taking these medications   Details  hydrOXYzine (ATARAX/VISTARIL) 25 MG tablet Take 1 tablet (25 mg total) by mouth every 6 (six) hours., Starting Fri 04/05/2017, Print      I personally performed the services described in this documentation, which was scribed in my presence. The recorded information has been reviewed and is accurate.   Anselm Pancoast, PA-C 04/05/17 2158  Derwood Kaplan, MD 04/06/17 1510

## 2017-04-05 NOTE — ED Triage Notes (Signed)
Pt states that she feels anxious today after she has been out of her xanax x 3 weeks. Alert and oriented.

## 2017-04-05 NOTE — Discharge Instructions (Signed)
Take the hydroxyzine every 6 hours for anxiety. This medication does not work the same as Xanax and thus will not feel the same. Use caution as this medication can make you drowsy. Follow-up with your primary care provider as soon as possible on this matter.

## 2017-08-07 ENCOUNTER — Encounter (HOSPITAL_COMMUNITY): Payer: Self-pay | Admitting: *Deleted

## 2017-08-07 ENCOUNTER — Other Ambulatory Visit (HOSPITAL_COMMUNITY): Payer: Self-pay | Admitting: *Deleted

## 2017-08-07 DIAGNOSIS — N644 Mastodynia: Secondary | ICD-10-CM

## 2017-08-20 ENCOUNTER — Ambulatory Visit
Admission: RE | Admit: 2017-08-20 | Discharge: 2017-08-20 | Disposition: A | Discharge status: Home / Self Care | Payer: No Typology Code available for payment source | Source: Ambulatory Visit | Attending: Obstetrics and Gynecology | Admitting: Obstetrics and Gynecology

## 2017-08-20 ENCOUNTER — Encounter (HOSPITAL_COMMUNITY): Payer: Self-pay

## 2017-08-20 ENCOUNTER — Ambulatory Visit (HOSPITAL_COMMUNITY)
Admission: RE | Admit: 2017-08-20 | Discharge: 2017-08-20 | Disposition: A | Discharge status: Home / Self Care | Payer: Self-pay | Source: Ambulatory Visit | Attending: Obstetrics and Gynecology | Admitting: Obstetrics and Gynecology

## 2017-08-20 VITALS — BP 132/84 | Ht 63.0 in | Wt 192.4 lb

## 2017-08-20 DIAGNOSIS — N644 Mastodynia: Secondary | ICD-10-CM

## 2017-08-20 DIAGNOSIS — Z1239 Encounter for other screening for malignant neoplasm of breast: Secondary | ICD-10-CM

## 2017-08-20 HISTORY — DX: Essential (primary) hypertension: I10

## 2017-08-20 NOTE — Patient Instructions (Signed)
Explained breast self awareness with Catherine Cruz. Patient did not need a Pap smear today due to patient has a history of a hysterectomy for benign reasons. Let patient know that she doesn't need any further Pap smears due to her history of a hysterectomy for benign reasons. [facility] for diagnostic mammogram and possible left breast ultrasound. Appointment scheduled for Tuesday, August 20, 2017 at 0920. Catherine BeanDella Cruz verbalized understanding.  Catherine Cruz, Catherine Maserhristine Poll, RN 8:15 AM

## 2017-08-20 NOTE — Progress Notes (Signed)
Complaints of left outer breast pain x 1 month that comes and goes. Patient rates the pain at a 1 out of 10.  Pap Smear: Pap smear not completed today. Last Pap smear was in 2012 or 2013 per patient and normal. Per patient has no history of an abnormal Pap smear. Patient has a history of a complete hysterectomy 32 years ago for fibroids. Patient no longer needs Pap smears due to her history of a hysterectomy for benign reasons per BCCCP and ACOG guidelines. No Pap smear results are in Epic.  Physical exam: Breasts Breasts symmetrical. No skin abnormalities bilateral breasts. No nipple retraction bilateral breasts. No nipple discharge bilateral breasts. No lymphadenopathy. No lumps palpated bilateral breasts. Complaints of left outer breast at 9 o'clock 10 cm from the nipple on exam. [facility] for diagnostic mammogram and possible left breast ultrasound. Appointment scheduled for Tuesday, August 20, 2017 at 0920.        Pelvic/Bimanual No Pap smear completed today since patient has a history of a hysterectomy for benign reasons. Pap smear not indicated per BCCCP guidelines.   Smoking History: Patient has never smoked.  Patient Navigation: Patient education provided. Access to services provided for [facility] through [facility].   Colorectal Cancer Screening: Per patient had a colonoscopy completed in 2016. No complaints today. FIT Test given to patient to complete and return to BCCCP.

## 2017-08-21 ENCOUNTER — Encounter (HOSPITAL_COMMUNITY): Payer: Self-pay | Admitting: *Deleted

## 2017-11-07 ENCOUNTER — Other Ambulatory Visit (HOSPITAL_COMMUNITY)
Admission: AD | Admit: 2017-11-07 | Discharge: 2017-11-07 | Disposition: A | Discharge status: Home / Self Care | Payer: No Typology Code available for payment source | Source: Ambulatory Visit | Attending: Family Medicine | Admitting: Family Medicine

## 2017-11-07 DIAGNOSIS — E876 Hypokalemia: Secondary | ICD-10-CM | POA: Diagnosis present

## 2017-11-07 DIAGNOSIS — D696 Thrombocytopenia, unspecified: Secondary | ICD-10-CM | POA: Diagnosis present

## 2017-11-07 DIAGNOSIS — D649 Anemia, unspecified: Secondary | ICD-10-CM | POA: Diagnosis present

## 2017-11-07 LAB — CBC WITH DIFFERENTIAL/PLATELET
BASOS ABS: 0 10*3/uL (ref 0.0–0.1)
BASOS PCT: 0 %
EOS ABS: 0.2 10*3/uL (ref 0.0–0.7)
Eosinophils Relative: 3 %
HEMATOCRIT: 36.7 % (ref 36.0–46.0)
HEMOGLOBIN: 11.7 g/dL — AB (ref 12.0–15.0)
Lymphocytes Relative: 46 %
Lymphs Abs: 4 10*3/uL (ref 0.7–4.0)
MCH: 27.4 pg (ref 26.0–34.0)
MCHC: 31.9 g/dL (ref 30.0–36.0)
MCV: 85.9 fL (ref 78.0–100.0)
Monocytes Absolute: 0.4 10*3/uL (ref 0.1–1.0)
Monocytes Relative: 5 %
NEUTROS ABS: 4 10*3/uL (ref 1.7–7.7)
NEUTROS PCT: 46 %
Platelets: 463 10*3/uL — ABNORMAL HIGH (ref 150–400)
RBC: 4.27 MIL/uL (ref 3.87–5.11)
RDW: 14.3 % (ref 11.5–15.5)
WBC: 8.7 10*3/uL (ref 4.0–10.5)

## 2017-11-07 LAB — LIPID PANEL
CHOL/HDL RATIO: 4 ratio
CHOLESTEROL: 163 mg/dL (ref 0–200)
HDL: 41 mg/dL (ref >40)
LDL Cholesterol: 93 mg/dL (ref 0–99)
TRIGLYCERIDES: 145 mg/dL (ref <150)
VLDL: 29 mg/dL (ref 0–40)

## 2017-11-07 LAB — HEMOGLOBIN A1C
Hgb A1c MFr Bld: 6.2 % — ABNORMAL HIGH (ref 4.8–5.6)
MEAN PLASMA GLUCOSE: 131.24 mg/dL

## 2017-11-07 LAB — COMPREHENSIVE METABOLIC PANEL
ALBUMIN: 3.8 g/dL (ref 3.5–5.0)
ALK PHOS: 89 U/L (ref 38–126)
ALT: 19 U/L (ref 14–54)
ANION GAP: 12 (ref 5–15)
AST: 23 U/L (ref 15–41)
BILIRUBIN TOTAL: 0.3 mg/dL (ref 0.3–1.2)
BUN: 12 mg/dL (ref 6–20)
CO2: 28 mmol/L (ref 22–32)
Calcium: 9.3 mg/dL (ref 8.9–10.3)
Chloride: 98 mmol/L — ABNORMAL LOW (ref 101–111)
Creatinine, Ser: 0.96 mg/dL (ref 0.44–1.00)
GFR calc Af Amer: >60 mL/min (ref >60)
GFR calc non Af Amer: >60 mL/min (ref >60)
GLUCOSE: 101 mg/dL — AB (ref 65–99)
POTASSIUM: 3.4 mmol/L — AB (ref 3.5–5.1)
SODIUM: 138 mmol/L (ref 135–145)
TOTAL PROTEIN: 8 g/dL (ref 6.5–8.1)

## 2017-11-07 LAB — TSH: TSH: 3.506 u[IU]/mL (ref 0.350–4.500)

## 2018-01-03 MED FILL — AMITRIPTYLINE HCL 25 MG TAB: 25 | 90 days supply | Qty: 90 | Fill #0

## 2018-01-03 MED FILL — HYDROCHLOROTHIAZIDE 25 MG T: 25 | 90 days supply | Qty: 90 | Fill #0

## 2018-01-03 MED FILL — LOVASTATIN 20 MG TABS: 20 | 90 days supply | Qty: 90 | Fill #0

## 2018-04-07 MED FILL — LOVASTATIN 20 MG TABS: 20 | 90 days supply | Qty: 90 | Fill #1

## 2018-04-07 MED FILL — AMITRIPTYLINE HCL 25 MG TAB: 25 | 90 days supply | Qty: 90 | Fill #1

## 2018-04-07 MED FILL — HYDROCHLOROTHIAZIDE 25 MG T: 25 | 90 days supply | Qty: 90 | Fill #1

## 2018-07-04 MED FILL — HYDROCHLOROTHIAZIDE 25 MG T: 25 | 60 days supply | Qty: 60 | Fill #2

## 2018-07-04 MED FILL — AMITRIPTYLINE HCL 25 MG TAB: 25 | 60 days supply | Qty: 60 | Fill #2

## 2018-07-04 MED FILL — LOVASTATIN 20 MG TABS: 20 | 60 days supply | Qty: 60 | Fill #2

## 2018-10-18 ENCOUNTER — Emergency Department (HOSPITAL_COMMUNITY)
Admission: EM | Admit: 2018-10-18 | Discharge: 2018-10-18 | Disposition: A | Discharge status: Home / Self Care | Payer: No Typology Code available for payment source | Attending: Emergency Medicine | Admitting: Emergency Medicine

## 2018-10-18 ENCOUNTER — Encounter (HOSPITAL_COMMUNITY): Payer: Self-pay | Admitting: *Deleted

## 2018-10-18 ENCOUNTER — Emergency Department (HOSPITAL_COMMUNITY)
Admission: EM | Admit: 2018-10-18 | Discharge: 2018-10-18 | Disposition: A | Discharge status: Home / Self Care | Payer: No Typology Code available for payment source | Source: Home / Self Care | Attending: Emergency Medicine | Admitting: Emergency Medicine

## 2018-10-18 ENCOUNTER — Other Ambulatory Visit: Payer: Self-pay

## 2018-10-18 DIAGNOSIS — Z79899 Other long term (current) drug therapy: Secondary | ICD-10-CM | POA: Insufficient documentation

## 2018-10-18 DIAGNOSIS — I10 Essential (primary) hypertension: Secondary | ICD-10-CM | POA: Insufficient documentation

## 2018-10-18 DIAGNOSIS — F411 Generalized anxiety disorder: Secondary | ICD-10-CM

## 2018-10-18 DIAGNOSIS — F419 Anxiety disorder, unspecified: Secondary | ICD-10-CM | POA: Insufficient documentation

## 2018-10-18 DIAGNOSIS — F410 Panic disorder [episodic paroxysmal anxiety] without agoraphobia: Secondary | ICD-10-CM | POA: Insufficient documentation

## 2018-10-18 MED ORDER — ALPRAZOLAM 0.5 MG PO TABS
1.0000 mg | ORAL_TABLET | Freq: Once | ORAL | Status: AC
  Administered 2018-10-18: 1 mg via ORAL
  Filled 2018-10-18: qty 2

## 2018-10-18 MED ORDER — ALPRAZOLAM 1 MG PO TABS: ORAL_TABLET | ORAL | 0 refills | Status: AC

## 2018-10-18 MED ORDER — HYDROXYZINE HCL 25 MG PO TABS
25.0000 mg | ORAL_TABLET | Freq: Once | ORAL | Status: AC
  Administered 2018-10-18: 25 mg via ORAL
  Filled 2018-10-18: qty 1

## 2018-10-18 MED ORDER — HYDROXYZINE HCL 25 MG PO TABS: 25.0000 mg | ORAL_TABLET | Freq: Four times a day (QID) | ORAL | 0 refills | Status: AC

## 2018-10-18 NOTE — ED Triage Notes (Signed)
Pt stated "I ran out of my xanax January 17.  The last time I was here I was written for ativan.  I have to make a doctor's appointment."

## 2018-10-18 NOTE — ED Triage Notes (Signed)
Pt was seen here for anxiety earlier this morning. Pt was given hydroxyzine. Pt states she began feeling a racing heart rate, acid reflux and increased anxiety ~1.5 hours after taking the medication.

## 2018-10-18 NOTE — ED Notes (Signed)
Bed: [facility]PT4 Expected date:  Expected time:  Means of arrival:  Comments: 

## 2018-10-18 NOTE — ED Provider Notes (Signed)
[facility]-EMERGENCY DEPT Provider Note   CSN: [id number] Arrival date & time: 10/18/18  1606     History   Chief Complaint Chief Complaint  Patient presents with  . Medication Reaction  . Anxiety    HPI Catherine Cruz is a 58 y.o. female.  58 year old female notes increased stress at home and has had worse anxiety.  Patient has been out of her Xanax for several days.  Was seen here earlier this morning for similar symptoms and given Vistaril which she said made her feel even worse.  Denies any SI or HI.  No cardiac or pulmonary symptoms.  Denies any headache.  Not the makes her symptoms better or worse.     Past Medical History:  Diagnosis Date  . Anxiety   . DJD (degenerative joint disease)    left thumb  . Hypertension   . Wears glasses     Patient Active Problem List   Diagnosis Date Noted  . 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 ABDOMINAL HYSTERECTOMY  1985  . CARPAL TUNNEL RELEASE  1989   left  . CARPOMETACARPAL (CMC) FUSION OF THUMB Right 02/26/2014   Procedure: RIGHT CARPOMETACARPAL (CMC) ARTHROPLASTY OF THUMB/ZANCOLLI ARTHROPLASTY/LEFT THUMB CMC INJECTION;  Surgeon: Dominica Severin, MD;  Location: [facility];  Service: Orthopedics;  Laterality: Right;  ANESTHESIA: GENERAL WITH BLOCK  . DILATION AND CURETTAGE OF UTERUS    . FINGER ARTHROSCOPY WITH CARPOMETACARPEL [facility]) ARTHROPLASTY Left 09/30/2015   Procedure: LEFT THUMB CARPOMETACARPAL (CMC) ARTHROPLASTY;  Surgeon: Dominica Severin, MD;  Location: [facility];  Service: Orthopedics;  Laterality: Left;  ANESTHESIA: GENERAL/BLOCK  . LAPAROSCOPIC OOPHORECTOMY  1988   loa  . TENDON TRANSFER Left 09/30/2015   Procedure: LEFT THUMB DOUBLE TENDON TRANSFER, REPAIR RECONSTRUCTION ;  Surgeon: Dominica Severin, MD;  Location: [facility];  Service: Orthopedics;  Laterality: Left;  . WISDOM TOOTH  EXTRACTION  1998     OB History    Gravida  2   Para      Term      Preterm      AB      Living  2     SAB      TAB      Ectopic      Multiple      Live Births  2            Home Medications    Prior to Admission medications   Medication Sig Start Date End Date Taking? Authorizing Provider  ALPRAZolam Prudy Feeler) 1 MG tablet One half to one at bedtime if needed for anxiety 09/22/13   Peyton Najjar, MD  amitriptyline (ELAVIL) 25 MG tablet Take 25 mg by mouth at bedtime.    [provider]  hydrochlorothiazide (HYDRODIURIL) 25 MG tablet Take 25 mg by mouth daily.    [provider]  hydrOXYzine (ATARAX/VISTARIL) 25 MG tablet Take 1 tablet (25 mg total) by mouth every 6 (six) hours. 10/18/18   Roxy Horseman, PA-C  lovastatin (MEVACOR) 20 MG tablet Take 20 mg by mouth at bedtime.    [provider]  methocarbamol (ROBAXIN) 500 MG tablet Take 1 tablet (500 mg total) by mouth every 6 (six) hours as needed for muscle spasms. Patient not taking: Reported on 04/05/2017 09/30/15   Karie Chimera, PA-C  oxyCODONE (OXY IR/ROXICODONE) 5 MG immediate release tablet Take 1-2 tablets (5-10 mg total) by  mouth every 4 (four) hours as needed for moderate pain. Patient not taking: Reported on 04/05/2017 09/30/15   Karie ChimeraBuchanan, Brian, PA-C    Family History Family History  Problem Relation Age of Onset  . Hypertension Mother     Social History Social History   Tobacco Use  . Smoking status: Never Smoker  . Smokeless tobacco: Never Used  Substance Use Topics  . Alcohol use: No  . Drug use: No     Allergies   Amoxicillin and Penicillins   Review of Systems Review of Systems  All other systems reviewed and are negative.    Physical Exam Updated Vital Signs BP (!) 176/94 (BP Location: Left Arm)   Pulse (!) 105   Temp 98 F (36.7 C) (Oral)   Resp 18   SpO2 98%   Physical Exam Vitals signs and nursing note reviewed.  Constitutional:       General: She is not in acute distress.    Appearance: Normal appearance. She is well-developed. She is not toxic-appearing.  HENT:     Head: Normocephalic and atraumatic.  Eyes:     General: Lids are normal.     Conjunctiva/sclera: Conjunctivae normal.     Pupils: Pupils are equal, round, and reactive to light.  Neck:     Musculoskeletal: Normal range of motion and neck supple.     Thyroid: No thyroid mass.     Trachea: No tracheal deviation.  Cardiovascular:     Rate and Rhythm: Normal rate and regular rhythm.     Heart sounds: Normal heart sounds. No murmur. No gallop.   Pulmonary:     Effort: Pulmonary effort is normal. No respiratory distress.     Breath sounds: Normal breath sounds. No stridor. No decreased breath sounds, wheezing, rhonchi or rales.  Abdominal:     General: Bowel sounds are normal. There is no distension.     Palpations: Abdomen is soft.     Tenderness: There is no abdominal tenderness. There is no rebound.  Musculoskeletal: Normal range of motion.        General: No tenderness.  Skin:    General: Skin is warm and dry.     Findings: No abrasion or rash.  Neurological:     Mental Status: She is alert and oriented to person, place, and time.     GCS: GCS eye subscore is 4. GCS verbal subscore is 5. GCS motor subscore is 6.     Cranial Nerves: No cranial nerve deficit.     Sensory: No sensory deficit.  Psychiatric:        Attention and Perception: She is attentive.        Mood and Affect: Mood is anxious.        Speech: Speech normal.        Behavior: Behavior normal.        Thought Content: Thought content does not include homicidal or suicidal ideation. Thought content does not include homicidal or suicidal plan.      ED Treatments / Results  Labs (all labs ordered are listed, but only abnormal results are displayed) Labs Reviewed - No data to display  EKG None  Radiology No results found.  Procedures Procedures (including critical care  time)  Medications Ordered in ED Medications  ALPRAZolam (XANAX) tablet 1 mg (has no administration in time range)     Initial Impression / Assessment and Plan / ED Course  I have reviewed the triage vital signs and the nursing notes.  Pertinent labs & imaging results that were available during my care of the patient were reviewed by me and considered in my medical decision making (see chart for details).    Pt given 1mg  of xanax Will give 4 days worth and she will f/u her pcp next   Final Clinical Impressions(s) / ED Diagnoses   Final diagnoses:  None    ED Discharge Orders    None       Lorre NickAllen, Tayte Mcwherter, MD 10/18/18 1644

## 2018-10-18 NOTE — ED Provider Notes (Signed)
[facility]ER[facility]PT Provider Note   CSN: [id number] Arrival date & time: 10/18/18  0411     History   Chief Complaint Chief Complaint  Patient presents with  . panic attacks    HPI Catherine Cruz is a 58 y.o. female.  Patient presents to the emergency department with a chief complaint of panic attack.  She states that she has felt anxious since yesterday.  Reports having had some heart palpitations as well as some generalized anxiousness sensation.  She states that sometimes when she gets anxious it causes her sinuses to run.  She states that she is feeling improved now.  She states she is out of her medications.  She would like something to help with her anxiety.  She does not currently have a follow-up appointment with her doctor.  She has been prescribed Ativan in the past.  Denies any other symptoms tonight.  The history is provided by the patient. No language interpreter was used.    Past Medical History:  Diagnosis Date  . Anxiety   . DJD (degenerative joint disease)    left thumb  . Hypertension   . Wears glasses     Patient Active Problem List   Diagnosis Date Noted  . 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 ABDOMINAL HYSTERECTOMY  1985  . CARPAL TUNNEL RELEASE  1989   left  . CARPOMETACARPAL (CMC) FUSION OF THUMB Right 02/26/2014   Procedure: RIGHT CARPOMETACARPAL (CMC) ARTHROPLASTY OF THUMB/ZANCOLLI ARTHROPLASTY/LEFT THUMB CMC INJECTION;  Surgeon: Dominica Severin, MD;  Location: [facility];  Service: Orthopedics;  Laterality: Right;  ANESTHESIA: GENERAL WITH BLOCK  . DILATION AND CURETTAGE OF UTERUS    . FINGER ARTHROSCOPY WITH CARPOMETACARPEL [facility]) ARTHROPLASTY Left 09/30/2015   Procedure: LEFT THUMB CARPOMETACARPAL (CMC) ARTHROPLASTY;  Surgeon: Dominica Severin, MD;  Location: [facility];  Service: Orthopedics;  Laterality: Left;  ANESTHESIA:  GENERAL/BLOCK  . LAPAROSCOPIC OOPHORECTOMY  1988   loa  . TENDON TRANSFER Left 09/30/2015   Procedure: LEFT THUMB DOUBLE TENDON TRANSFER, REPAIR RECONSTRUCTION ;  Surgeon: Dominica Severin, MD;  Location: [facility];  Service: Orthopedics;  Laterality: Left;  . WISDOM TOOTH EXTRACTION  1998     OB History    Gravida  2   Para      Term      Preterm      AB      Living  2     SAB      TAB      Ectopic      Multiple      Live Births  2            Home Medications    Prior to Admission medications   Medication Sig Start Date End Date Taking? Authorizing Provider  ALPRAZolam Prudy Feeler) 1 MG tablet One half to one at bedtime if needed for anxiety 09/22/13   Peyton Najjar, MD  amitriptyline (ELAVIL) 25 MG tablet Take 25 mg by mouth at bedtime.    [provider]  hydrochlorothiazide (HYDRODIURIL) 25 MG tablet Take 25 mg by mouth daily.    [provider]  hydrOXYzine (ATARAX/VISTARIL) 25 MG tablet Take 1 tablet (25 mg total) by mouth every 6 (six) hours. 10/18/18   Roxy Horseman, PA-C  lovastatin (MEVACOR) 20 MG tablet Take 20 mg by mouth at bedtime.    [provider]  methocarbamol (ROBAXIN) 500  MG tablet Take 1 tablet (500 mg total) by mouth every 6 (six) hours as needed for muscle spasms. Patient not taking: Reported on 04/05/2017 09/30/15   Karie ChimeraBuchanan, Brian, PA-C  oxyCODONE (OXY IR/ROXICODONE) 5 MG immediate release tablet Take 1-2 tablets (5-10 mg total) by mouth every 4 (four) hours as needed for moderate pain. Patient not taking: Reported on 04/05/2017 09/30/15   Karie ChimeraBuchanan, Brian, PA-C    Family History Family History  Problem Relation Age of Onset  . Hypertension Mother     Social History Social History   Tobacco Use  . Smoking status: Never Smoker  . Smokeless tobacco: Never Used  Substance Use Topics  . Alcohol use: No  . Drug use: No     Allergies   Amoxicillin and Penicillins   Review of Systems Review  of Systems  All other systems reviewed and are negative.    Physical Exam Updated Vital Signs BP (!) 151/92 (BP Location: Left Arm)   Pulse 83   Temp 98 F (36.7 C) (Oral)   Resp 16   Ht 5\' 3"  (1.6 m)   Wt 85.7 kg   SpO2 99%   BMI 33.48 kg/m   Physical Exam Vitals signs and nursing note reviewed.  Constitutional:      Appearance: She is well-developed.  HENT:     Head: Normocephalic and atraumatic.  Eyes:     Conjunctiva/sclera: Conjunctivae normal.     Pupils: Pupils are equal, round, and reactive to light.  Neck:     Musculoskeletal: Normal range of motion and neck supple.  Cardiovascular:     Rate and Rhythm: Normal rate and regular rhythm.     Heart sounds: No murmur. No friction rub. No gallop.   Pulmonary:     Effort: Pulmonary effort is normal. No respiratory distress.     Breath sounds: Normal breath sounds. No wheezing or rales.  Chest:     Chest wall: No tenderness.  Abdominal:     General: Bowel sounds are normal. There is no distension.     Palpations: Abdomen is soft. There is no mass.     Tenderness: There is no abdominal tenderness. There is no guarding or rebound.  Musculoskeletal: Normal range of motion.        General: No tenderness.  Skin:    General: Skin is warm and dry.  Neurological:     Mental Status: She is alert and oriented to person, place, and time.  Psychiatric:        Behavior: Behavior normal.        Thought Content: Thought content normal.        Judgment: Judgment normal.      ED Treatments / Results  Labs (all labs ordered are listed, but only abnormal results are displayed) Labs Reviewed - No data to display  EKG None  Radiology No results found.  Procedures Procedures (including critical care time)  Medications Ordered in ED Medications  hydrOXYzine (ATARAX/VISTARIL) tablet 25 mg (has no administration in time range)     Initial Impression / Assessment and Plan / ED Course  I have reviewed the triage  vital signs and the nursing notes.  Pertinent labs & imaging results that were available during my care of the patient were reviewed by me and considered in my medical decision making (see chart for details).     She with history of anxiety, states that she has had a panic attack, is feeling improved now.  Vital signs are  stable.  She also states that she has had a runny nose, and attributes this to her anxiety.  I will prescribe hydroxyzine, and have advised her to follow-up with her primary care doctor.  She is stable appearing, and in no acute distress.  Final Clinical Impressions(s) / ED Diagnoses   Final diagnoses:  Panic attack    ED Discharge Orders         Ordered    hydrOXYzine (ATARAX/VISTARIL) 25 MG tablet  Every 6 hours     10/18/18 0528           Roxy Horseman, PA-C 10/18/18 0543    Nira Conn, MD 10/18/18 [phone]

## 2019-05-21 ENCOUNTER — Other Ambulatory Visit: Payer: Self-pay | Admitting: Nurse Practitioner

## 2019-05-21 DIAGNOSIS — Z1231 Encounter for screening mammogram for malignant neoplasm of breast: Secondary | ICD-10-CM

## 2019-05-26 ENCOUNTER — Ambulatory Visit
Admission: RE | Admit: 2019-05-26 | Discharge: 2019-05-26 | Disposition: A | Discharge status: Home / Self Care | Payer: 59 | Source: Ambulatory Visit | Attending: *Deleted | Admitting: *Deleted

## 2019-05-26 ENCOUNTER — Other Ambulatory Visit: Payer: Self-pay

## 2019-05-26 DIAGNOSIS — Z1231 Encounter for screening mammogram for malignant neoplasm of breast: Secondary | ICD-10-CM

## 2020-07-21 ENCOUNTER — Other Ambulatory Visit: Payer: Self-pay | Admitting: Nurse Practitioner

## 2020-07-21 DIAGNOSIS — Z1231 Encounter for screening mammogram for malignant neoplasm of breast: Secondary | ICD-10-CM

## 2020-09-20 ENCOUNTER — Ambulatory Visit: Payer: 59

## 2020-11-11 ENCOUNTER — Ambulatory Visit: Payer: 59

## 2020-12-30 ENCOUNTER — Ambulatory Visit
Admission: RE | Admit: 2020-12-30 | Discharge: 2020-12-30 | Disposition: A | Discharge status: Home / Self Care | Payer: 59 | Source: Ambulatory Visit | Attending: *Deleted | Admitting: *Deleted

## 2020-12-30 ENCOUNTER — Other Ambulatory Visit: Payer: Self-pay

## 2020-12-30 DIAGNOSIS — Z1231 Encounter for screening mammogram for malignant neoplasm of breast: Secondary | ICD-10-CM

## 2021-01-03 ENCOUNTER — Other Ambulatory Visit: Payer: Self-pay | Admitting: Nurse Practitioner

## 2021-01-03 DIAGNOSIS — R928 Other abnormal and inconclusive findings on diagnostic imaging of breast: Secondary | ICD-10-CM

## 2021-03-15 ENCOUNTER — Ambulatory Visit
Admission: RE | Admit: 2021-03-15 | Discharge: 2021-03-15 | Disposition: A | Discharge status: Home / Self Care | Payer: 59 | Source: Ambulatory Visit | Attending: *Deleted | Admitting: *Deleted

## 2021-03-15 ENCOUNTER — Other Ambulatory Visit: Payer: Self-pay | Admitting: Nurse Practitioner

## 2021-03-15 DIAGNOSIS — K5900 Constipation, unspecified: Secondary | ICD-10-CM

## 2021-03-15 DIAGNOSIS — R1030 Lower abdominal pain, unspecified: Secondary | ICD-10-CM

## 2021-03-23 ENCOUNTER — Inpatient Hospital Stay: Admission: RE | Admit: 2021-03-23 | Payer: 59 | Source: Ambulatory Visit

## 2021-03-24 ENCOUNTER — Other Ambulatory Visit: Payer: Self-pay | Admitting: Nurse Practitioner

## 2021-03-24 DIAGNOSIS — R928 Other abnormal and inconclusive findings on diagnostic imaging of breast: Secondary | ICD-10-CM

## 2021-04-11 ENCOUNTER — Other Ambulatory Visit: Payer: Self-pay | Admitting: Nurse Practitioner

## 2021-04-11 ENCOUNTER — Ambulatory Visit
Admission: RE | Admit: 2021-04-11 | Discharge: 2021-04-11 | Disposition: A | Discharge status: Home / Self Care | Payer: 59 | Source: Ambulatory Visit | Attending: Specialist | Admitting: Specialist

## 2021-04-11 ENCOUNTER — Other Ambulatory Visit: Payer: Self-pay

## 2021-04-11 DIAGNOSIS — R928 Other abnormal and inconclusive findings on diagnostic imaging of breast: Secondary | ICD-10-CM

## 2021-10-13 ENCOUNTER — Ambulatory Visit
Admission: RE | Admit: 2021-10-13 | Discharge: 2021-10-13 | Disposition: A | Discharge status: Home / Self Care | Payer: 59 | Source: Ambulatory Visit | Attending: Specialist | Admitting: Specialist

## 2021-10-13 DIAGNOSIS — R928 Other abnormal and inconclusive findings on diagnostic imaging of breast: Secondary | ICD-10-CM

## 2021-10-28 ENCOUNTER — Ambulatory Visit
Admission: RE | Admit: 2021-10-28 | Discharge: 2021-10-28 | Disposition: A | Discharge status: Home / Self Care | Payer: 59 | Source: Ambulatory Visit | Attending: Specialist | Admitting: Specialist

## 2021-10-28 DIAGNOSIS — R928 Other abnormal and inconclusive findings on diagnostic imaging of breast: Secondary | ICD-10-CM

## 2021-11-11 ENCOUNTER — Other Ambulatory Visit: Payer: Self-pay | Admitting: Nurse Practitioner

## 2021-11-11 ENCOUNTER — Ambulatory Visit
Admission: RE | Admit: 2021-11-11 | Discharge: 2021-11-11 | Disposition: A | Discharge status: Home / Self Care | Payer: 59 | Source: Ambulatory Visit | Attending: Specialist | Admitting: Specialist

## 2021-11-11 DIAGNOSIS — R921 Mammographic calcification found on diagnostic imaging of breast: Secondary | ICD-10-CM

## 2021-11-11 DIAGNOSIS — R928 Other abnormal and inconclusive findings on diagnostic imaging of breast: Secondary | ICD-10-CM

## 2022-04-27 ENCOUNTER — Other Ambulatory Visit: Payer: Self-pay | Admitting: Nurse Practitioner

## 2022-04-27 ENCOUNTER — Ambulatory Visit
Admission: RE | Admit: 2022-04-27 | Discharge: 2022-04-27 | Disposition: A | Discharge status: Home / Self Care | Payer: 59 | Source: Ambulatory Visit | Attending: Specialist | Admitting: Specialist

## 2022-04-27 DIAGNOSIS — R921 Mammographic calcification found on diagnostic imaging of breast: Secondary | ICD-10-CM

## 2022-05-11 ENCOUNTER — Ambulatory Visit
Admission: RE | Admit: 2022-05-11 | Discharge: 2022-05-11 | Disposition: A | Discharge status: Home / Self Care | Payer: 59 | Source: Ambulatory Visit | Attending: Specialist | Admitting: Specialist

## 2022-05-11 DIAGNOSIS — R921 Mammographic calcification found on diagnostic imaging of breast: Secondary | ICD-10-CM

## 2022-05-14 ENCOUNTER — Other Ambulatory Visit: Payer: Self-pay | Admitting: Specialist

## 2022-05-14 ENCOUNTER — Other Ambulatory Visit: Payer: Self-pay | Admitting: Family

## 2022-11-01 ENCOUNTER — Other Ambulatory Visit (HOSPITAL_COMMUNITY): Payer: Self-pay | Admitting: Gastroenterology

## 2022-11-01 DIAGNOSIS — R1084 Generalized abdominal pain: Secondary | ICD-10-CM

## 2022-11-07 ENCOUNTER — Ambulatory Visit (HOSPITAL_BASED_OUTPATIENT_CLINIC_OR_DEPARTMENT_OTHER): Admission: RE | Admit: 2022-11-07 | Payer: 59 | Source: Ambulatory Visit

## 2022-11-07 ENCOUNTER — Encounter (HOSPITAL_BASED_OUTPATIENT_CLINIC_OR_DEPARTMENT_OTHER): Payer: Self-pay

## 2022-11-08 ENCOUNTER — Telehealth (HOSPITAL_BASED_OUTPATIENT_CLINIC_OR_DEPARTMENT_OTHER): Payer: Self-pay

## 2023-08-01 IMAGING — MG DIGITAL DIAGNOSTIC BILAT W/ TOMO W/ CAD
8 of 11 series · 8 of 27 positions shown · non-contrast
Comparison: Previous exam(s).

CLINICAL DATA: Six-month follow-up of right breast calcifications.
Bilateral mammography was performed.

EXAM:
DIGITAL DIAGNOSTIC BILATERAL MAMMOGRAM WITH TOMOSYNTHESIS AND CAD
TECHNIQUE: Bilateral digital diagnostic mammography and breast tomosynthesis
was performed. The images were evaluated with computer-aided
detection.

[R MLO]
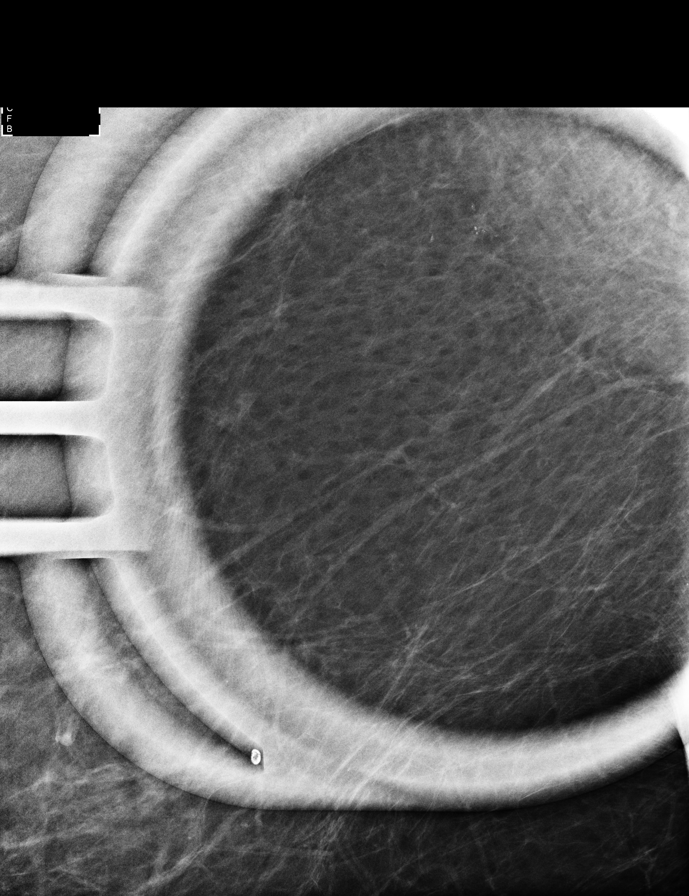

[R CC (1 of 2)]
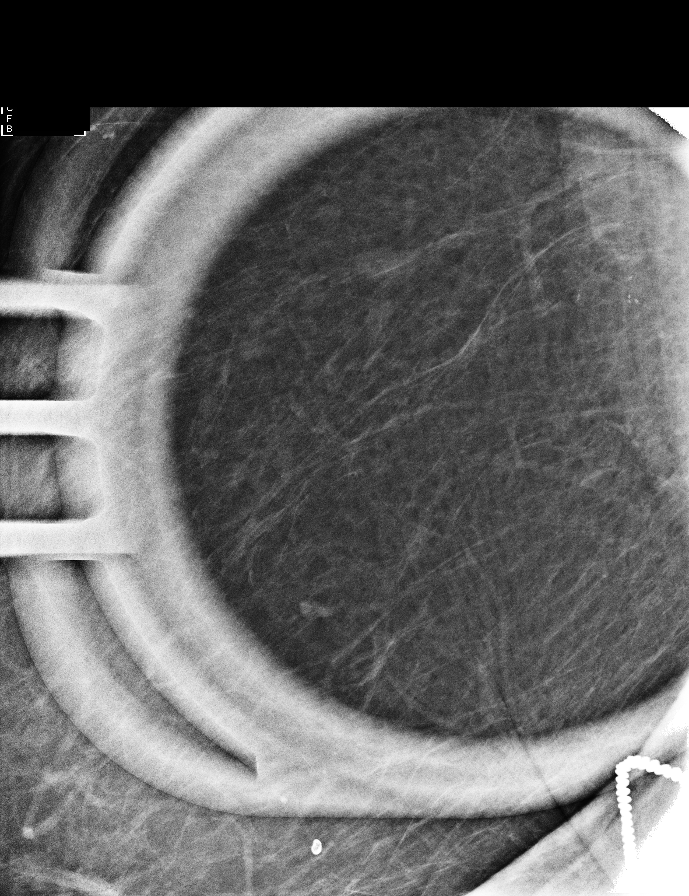

[R CC (2 of 2)]
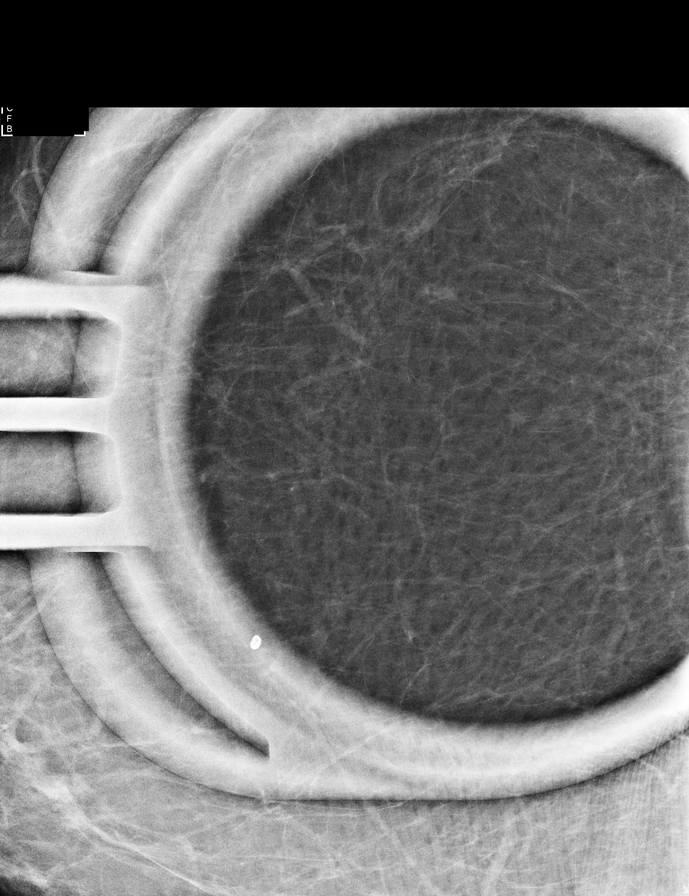

[R CC synth-2D]
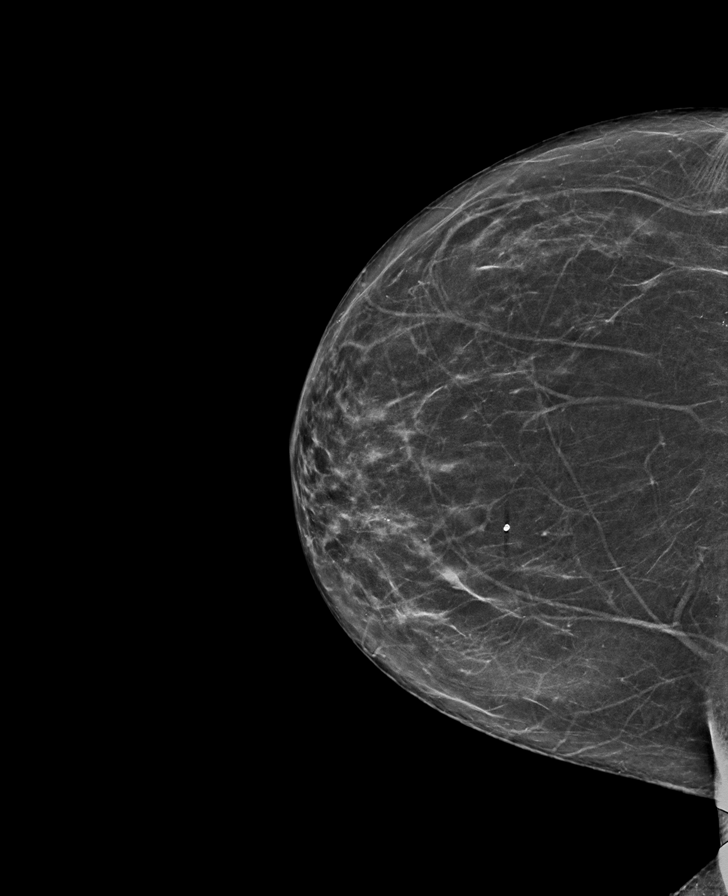

[L MLO synth-2D]
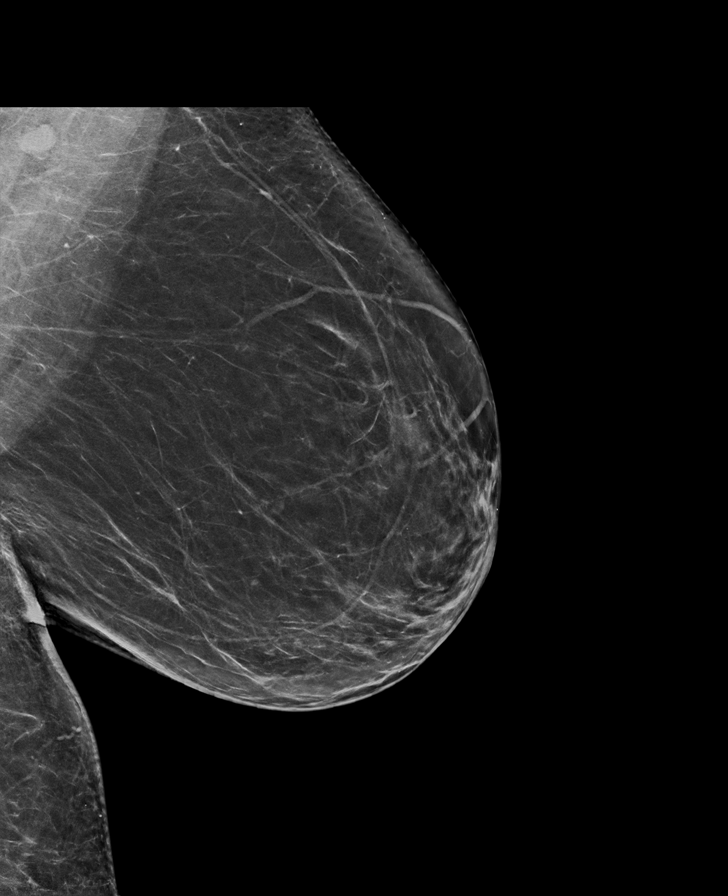

[L CC synth-2D]
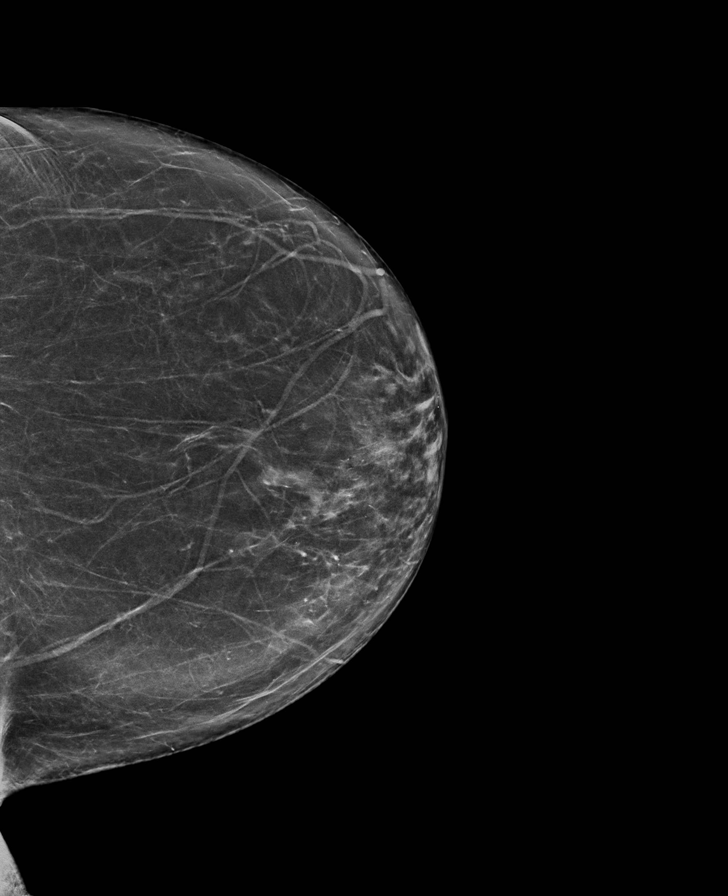

[R MLO synth-2D]
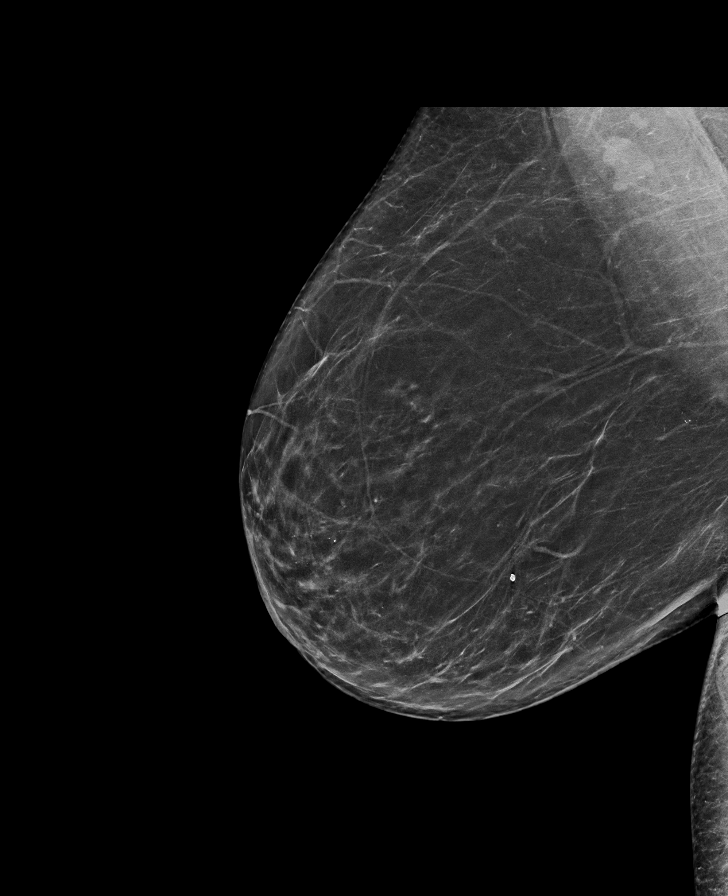

[R CC tomo · tomo slice 35/69.0]
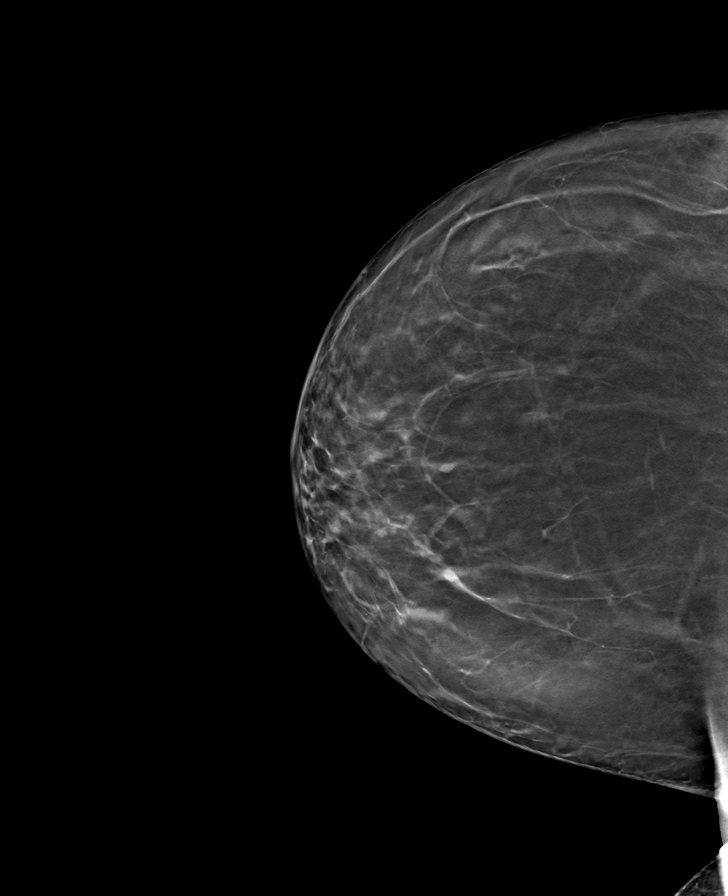

[8 of 27 positions shown; findings below may reference images not displayed]

ACR Breast Density Category b: There are scattered areas of
fibroglandular density.
FINDINGS: The calcifications in the lateral right breast are stable. No other
changes or suspicious findings in either breast.
IMPRESSION: Stable probably benign right breast calcifications.

RECOMMENDATION:
Recommend six-month follow-up mammography of the probably benign
right breast calcifications.

I have discussed the findings and recommendations with the patient.
If applicable, a reminder letter will be sent to the patient
regarding the next appointment.

BI-RADS CATEGORY  3: Probably benign.
